# Patient Record
Sex: Female | Born: 1945
Health system: Southern US, Community
[De-identification: ages and names within clinical notes are randomized; demographics above are authoritative.]

## PROBLEM LIST (undated history)

## (undated) DIAGNOSIS — I10 Essential (primary) hypertension: Secondary | ICD-10-CM

## (undated) DIAGNOSIS — H269 Unspecified cataract: Secondary | ICD-10-CM

## (undated) DIAGNOSIS — M1990 Unspecified osteoarthritis, unspecified site: Secondary | ICD-10-CM

## (undated) HISTORY — PX: EYE SURGERY: SHX253

## (undated) HISTORY — PX: ABDOMINAL HYSTERECTOMY: SHX81

---

## 1998-07-15 ENCOUNTER — Other Ambulatory Visit: Admission: RE | Admit: 1998-07-15 | Discharge: 1998-07-15 | Payer: Self-pay | Admitting: Family Medicine

## 1999-11-15 ENCOUNTER — Other Ambulatory Visit: Admission: RE | Admit: 1999-11-15 | Discharge: 1999-11-15 | Payer: Self-pay | Admitting: Family Medicine

## 1999-11-21 ENCOUNTER — Encounter: Payer: Self-pay | Admitting: Family Medicine

## 1999-11-21 ENCOUNTER — Ambulatory Visit (HOSPITAL_COMMUNITY): Admission: RE | Admit: 1999-11-21 | Discharge: 1999-11-21 | Payer: Self-pay | Admitting: Family Medicine

## 2000-09-06 ENCOUNTER — Encounter: Payer: Self-pay | Admitting: Family Medicine

## 2000-09-06 ENCOUNTER — Encounter: Admission: RE | Admit: 2000-09-06 | Discharge: 2000-09-06 | Payer: Self-pay | Admitting: Family Medicine

## 2001-01-16 ENCOUNTER — Other Ambulatory Visit: Admission: RE | Admit: 2001-01-16 | Discharge: 2001-01-16 | Payer: Self-pay | Admitting: Family Medicine

## 2001-02-02 ENCOUNTER — Emergency Department (HOSPITAL_COMMUNITY): Admission: EM | Admit: 2001-02-02 | Discharge: 2001-02-02 | Payer: Self-pay | Admitting: Emergency Medicine

## 2002-03-11 ENCOUNTER — Other Ambulatory Visit: Admission: RE | Admit: 2002-03-11 | Discharge: 2002-03-11 | Payer: Self-pay | Admitting: Family Medicine

## 2003-07-07 ENCOUNTER — Other Ambulatory Visit: Admission: RE | Admit: 2003-07-07 | Discharge: 2003-07-07 | Payer: Self-pay | Admitting: Family Medicine

## 2003-10-20 ENCOUNTER — Encounter: Admission: RE | Admit: 2003-10-20 | Discharge: 2003-10-20 | Payer: Self-pay | Admitting: Family Medicine

## 2004-01-22 ENCOUNTER — Ambulatory Visit (HOSPITAL_COMMUNITY): Admission: RE | Admit: 2004-01-22 | Discharge: 2004-01-22 | Payer: Self-pay | Admitting: Gastroenterology

## 2005-08-07 ENCOUNTER — Encounter: Admission: RE | Admit: 2005-08-07 | Discharge: 2005-08-07 | Payer: Self-pay | Admitting: Family Medicine

## 2005-09-04 ENCOUNTER — Ambulatory Visit (HOSPITAL_COMMUNITY): Admission: RE | Admit: 2005-09-04 | Discharge: 2005-09-04 | Payer: Self-pay | Admitting: Family Medicine

## 2006-07-25 ENCOUNTER — Other Ambulatory Visit: Admission: RE | Admit: 2006-07-25 | Discharge: 2006-07-25 | Payer: Self-pay | Admitting: Family Medicine

## 2007-08-30 ENCOUNTER — Other Ambulatory Visit: Admission: RE | Admit: 2007-08-30 | Discharge: 2007-08-30 | Payer: Self-pay | Admitting: Family Medicine

## 2007-09-22 ENCOUNTER — Emergency Department (HOSPITAL_COMMUNITY): Admission: EM | Admit: 2007-09-22 | Discharge: 2007-09-22 | Payer: Self-pay | Admitting: Emergency Medicine

## 2008-02-03 ENCOUNTER — Ambulatory Visit (HOSPITAL_COMMUNITY): Admission: RE | Admit: 2008-02-03 | Discharge: 2008-02-03 | Payer: Self-pay | Admitting: Family Medicine

## 2009-03-13 ENCOUNTER — Emergency Department (HOSPITAL_COMMUNITY): Admission: EM | Admit: 2009-03-13 | Discharge: 2009-03-13 | Payer: Self-pay | Admitting: Emergency Medicine

## 2010-01-11 ENCOUNTER — Other Ambulatory Visit: Admission: RE | Admit: 2010-01-11 | Discharge: 2010-01-11 | Payer: Self-pay | Admitting: Family Medicine

## 2010-01-19 ENCOUNTER — Encounter: Admission: RE | Admit: 2010-01-19 | Discharge: 2010-01-19 | Payer: Self-pay | Admitting: Family Medicine

## 2010-10-15 ENCOUNTER — Encounter: Payer: Self-pay | Admitting: Family Medicine

## 2011-01-02 LAB — POCT I-STAT, CHEM 8
BUN: 11 mg/dL (ref 6–23)
Calcium, Ion: 1.27 mmol/L (ref 1.12–1.32)
Creatinine, Ser: 0.8 mg/dL (ref 0.4–1.2)
HCT: 46 % (ref 36.0–46.0)
Hemoglobin: 15.6 g/dL — ABNORMAL HIGH (ref 12.0–15.0)
Potassium: 2.9 mEq/L — ABNORMAL LOW (ref 3.5–5.1)
TCO2: 37 mmol/L (ref 0–100)

## 2011-01-02 LAB — URINALYSIS, ROUTINE W REFLEX MICROSCOPIC
Protein, ur: NEGATIVE mg/dL
Urobilinogen, UA: 0.2 mg/dL (ref 0.0–1.0)

## 2011-02-10 NOTE — Op Note (Signed)
NAME:  Theresa Casey, Theresa Casey NO.:  [id number]   MEDICAL RECORD NO.:  [id number]                   PATIENT TYPE:  AMB   LOCATION:  ENDO                                 FACILITY:  [facility]   PHYSICIAN:  Graylin Shiver, M.D.                DATE OF BIRTH:  1946/02/14   DATE OF PROCEDURE:  01/22/2004  DATE OF DISCHARGE:                                 OPERATIVE REPORT   PROCEDURE:  Flexible sigmoidoscopy.   INDICATIONS FOR PROCEDURE:  Screening.   Informed consent was obtained after explanation of the risks of bleeding,  infection and perforation.   PREMEDICATION:  Fentanyl 55 mcg IV, Versed 5 mg IV.   DESCRIPTION OF PROCEDURE:  A full colonoscopy was intended for today. A  rectal exam was performed, no masses were felt.  The Olympus pediatric  adjustable colonoscope was inserted into the rectum and advanced into the  sigmoid colon. The sigmoid was fixed and tortous and cold not pass the scope  beyond about 20 cm.  I then removed the pediatric colonoscope and attempted  with a gastroscope.  I was able to get the gastroscope a little further into  the sigmoid but the sigmoid was fixed and tortuous and the scope would not  pass despite applying pressure to the abdomen and changing position of the  patient.  No specific abnormalities were seen. She tolerated the procedure  well without complications.   IMPRESSION:  Fixed sigmoid.   PLAN:  Barium enema.                                               Graylin Shiver, M.D.    Germain Osgood  D:  01/22/2004  T:  01/22/2004  Job:  [id number]   cc:   Renaye Rakers, M.D.  [phone] [address], [address]  [place]  [place] [address]  Fax: [phone]

## 2012-06-19 ENCOUNTER — Other Ambulatory Visit (HOSPITAL_COMMUNITY)
Admission: RE | Admit: 2012-06-19 | Discharge: 2012-06-19 | Disposition: A | Discharge status: Home / Self Care | Payer: Medicare Other | Source: Ambulatory Visit | Attending: Family Medicine | Admitting: Family Medicine

## 2012-06-19 DIAGNOSIS — Z124 Encounter for screening for malignant neoplasm of cervix: Secondary | ICD-10-CM | POA: Insufficient documentation

## 2012-06-19 DIAGNOSIS — Z1151 Encounter for screening for human papillomavirus (HPV): Secondary | ICD-10-CM | POA: Insufficient documentation

## 2015-08-06 ENCOUNTER — Other Ambulatory Visit: Payer: Self-pay | Admitting: Family Medicine

## 2015-08-06 ENCOUNTER — Ambulatory Visit
Admission: RE | Admit: 2015-08-06 | Discharge: 2015-08-06 | Disposition: A | Discharge status: Home / Self Care | Payer: Medicare HMO | Source: Ambulatory Visit | Attending: Family Medicine | Admitting: Family Medicine

## 2015-08-06 DIAGNOSIS — M25562 Pain in left knee: Secondary | ICD-10-CM

## 2015-08-06 DIAGNOSIS — G8929 Other chronic pain: Secondary | ICD-10-CM

## 2015-08-06 DIAGNOSIS — M25561 Pain in right knee: Secondary | ICD-10-CM

## 2015-08-06 DIAGNOSIS — M25552 Pain in left hip: Secondary | ICD-10-CM

## 2015-08-06 DIAGNOSIS — M545 Low back pain: Secondary | ICD-10-CM

## 2015-08-06 DIAGNOSIS — M533 Sacrococcygeal disorders, not elsewhere classified: Secondary | ICD-10-CM

## 2015-08-06 DIAGNOSIS — M1250 Traumatic arthropathy, unspecified site: Secondary | ICD-10-CM

## 2015-08-06 DIAGNOSIS — M25551 Pain in right hip: Secondary | ICD-10-CM

## 2015-08-09 ENCOUNTER — Ambulatory Visit
Admission: RE | Admit: 2015-08-09 | Discharge: 2015-08-09 | Disposition: A | Discharge status: Home / Self Care | Payer: Medicare HMO | Source: Ambulatory Visit | Attending: Family Medicine | Admitting: Family Medicine

## 2015-08-09 ENCOUNTER — Other Ambulatory Visit: Payer: Self-pay | Admitting: Family Medicine

## 2015-08-09 DIAGNOSIS — M1250 Traumatic arthropathy, unspecified site: Secondary | ICD-10-CM

## 2015-08-11 ENCOUNTER — Ambulatory Visit
Admission: RE | Admit: 2015-08-11 | Discharge: 2015-08-11 | Disposition: A | Discharge status: Home / Self Care | Payer: Medicare HMO | Source: Ambulatory Visit | Attending: Family Medicine | Admitting: Family Medicine

## 2015-08-11 DIAGNOSIS — M1250 Traumatic arthropathy, unspecified site: Secondary | ICD-10-CM

## 2015-10-14 DIAGNOSIS — E876 Hypokalemia: Secondary | ICD-10-CM | POA: Diagnosis not present

## 2015-10-14 DIAGNOSIS — I10 Essential (primary) hypertension: Secondary | ICD-10-CM | POA: Diagnosis not present

## 2015-10-14 DIAGNOSIS — M1250 Traumatic arthropathy, unspecified site: Secondary | ICD-10-CM | POA: Diagnosis not present

## 2015-11-06 DIAGNOSIS — L853 Xerosis cutis: Secondary | ICD-10-CM | POA: Diagnosis not present

## 2016-01-19 DIAGNOSIS — H2513 Age-related nuclear cataract, bilateral: Secondary | ICD-10-CM | POA: Diagnosis not present

## 2016-02-22 DIAGNOSIS — H18413 Arcus senilis, bilateral: Secondary | ICD-10-CM | POA: Diagnosis not present

## 2016-03-30 DIAGNOSIS — H04563 Stenosis of bilateral lacrimal punctum: Secondary | ICD-10-CM | POA: Diagnosis not present

## 2016-04-29 DIAGNOSIS — M1280 Other specific arthropathies, not elsewhere classified, unspecified site: Secondary | ICD-10-CM | POA: Diagnosis not present

## 2016-04-29 DIAGNOSIS — I10 Essential (primary) hypertension: Secondary | ICD-10-CM | POA: Diagnosis not present

## 2016-09-15 DIAGNOSIS — R7301 Impaired fasting glucose: Secondary | ICD-10-CM | POA: Diagnosis not present

## 2016-09-15 DIAGNOSIS — H6123 Impacted cerumen, bilateral: Secondary | ICD-10-CM | POA: Diagnosis not present

## 2016-09-15 DIAGNOSIS — I10 Essential (primary) hypertension: Secondary | ICD-10-CM | POA: Diagnosis not present

## 2016-09-15 DIAGNOSIS — E876 Hypokalemia: Secondary | ICD-10-CM | POA: Diagnosis not present

## 2016-09-16 DIAGNOSIS — H6123 Impacted cerumen, bilateral: Secondary | ICD-10-CM | POA: Diagnosis not present

## 2016-09-19 DIAGNOSIS — H6123 Impacted cerumen, bilateral: Secondary | ICD-10-CM | POA: Diagnosis not present

## 2017-02-14 LAB — GLUCOSE, POCT (MANUAL RESULT ENTRY): POC Glucose: 90 mg/dl (ref 70–99)

## 2017-04-06 ENCOUNTER — Encounter (HOSPITAL_COMMUNITY): Payer: Self-pay | Admitting: Emergency Medicine

## 2017-04-06 ENCOUNTER — Emergency Department (HOSPITAL_COMMUNITY)
Admission: EM | Admit: 2017-04-06 | Discharge: 2017-04-07 | Disposition: A | Discharge status: Home / Self Care | Payer: Medicare PPO | Attending: Emergency Medicine | Admitting: Emergency Medicine

## 2017-04-06 DIAGNOSIS — Z7982 Long term (current) use of aspirin: Secondary | ICD-10-CM | POA: Diagnosis not present

## 2017-04-06 DIAGNOSIS — Z79899 Other long term (current) drug therapy: Secondary | ICD-10-CM | POA: Diagnosis not present

## 2017-04-06 DIAGNOSIS — F17200 Nicotine dependence, unspecified, uncomplicated: Secondary | ICD-10-CM | POA: Insufficient documentation

## 2017-04-06 DIAGNOSIS — I10 Essential (primary) hypertension: Secondary | ICD-10-CM | POA: Insufficient documentation

## 2017-04-06 DIAGNOSIS — R197 Diarrhea, unspecified: Secondary | ICD-10-CM

## 2017-04-06 DIAGNOSIS — R112 Nausea with vomiting, unspecified: Secondary | ICD-10-CM | POA: Diagnosis present

## 2017-04-06 DIAGNOSIS — K529 Noninfective gastroenteritis and colitis, unspecified: Secondary | ICD-10-CM | POA: Diagnosis not present

## 2017-04-06 DIAGNOSIS — R1084 Generalized abdominal pain: Secondary | ICD-10-CM

## 2017-04-06 HISTORY — DX: Unspecified osteoarthritis, unspecified site: M19.90

## 2017-04-06 HISTORY — DX: Essential (primary) hypertension: I10

## 2017-04-06 LAB — COMPREHENSIVE METABOLIC PANEL
ALBUMIN: 3.4 g/dL — AB (ref 3.5–5.0)
ALT: 13 U/L — ABNORMAL LOW (ref 14–54)
ANION GAP: 7 (ref 5–15)
AST: 16 U/L (ref 15–41)
Alkaline Phosphatase: 70 U/L (ref 38–126)
BILIRUBIN TOTAL: 0.8 mg/dL (ref 0.3–1.2)
BUN: 15 mg/dL (ref 6–20)
CO2: 29 mmol/L (ref 22–32)
Calcium: 8.9 mg/dL (ref 8.9–10.3)
Chloride: 105 mmol/L (ref 101–111)
Creatinine, Ser: 0.8 mg/dL (ref 0.44–1.00)
GFR calc Af Amer: >60 mL/min (ref >60)
GLUCOSE: 106 mg/dL — AB (ref 65–99)
POTASSIUM: 3.1 mmol/L — AB (ref 3.5–5.1)
Sodium: 141 mmol/L (ref 135–145)
TOTAL PROTEIN: 6.2 g/dL — AB (ref 6.5–8.1)

## 2017-04-06 LAB — CBC
HEMATOCRIT: 39.4 % (ref 36.0–46.0)
Hemoglobin: 12.7 g/dL (ref 12.0–15.0)
MCH: 29.8 pg (ref 26.0–34.0)
MCHC: 32.2 g/dL (ref 30.0–36.0)
MCV: 92.5 fL (ref 78.0–100.0)
Platelets: 167 10*3/uL (ref 150–400)
RBC: 4.26 MIL/uL (ref 3.87–5.11)
RDW: 13.9 % (ref 11.5–15.5)
WBC: 13.2 10*3/uL — AB (ref 4.0–10.5)

## 2017-04-06 LAB — URINALYSIS, ROUTINE W REFLEX MICROSCOPIC
BILIRUBIN URINE: NEGATIVE
GLUCOSE, UA: NEGATIVE mg/dL
Ketones, ur: NEGATIVE mg/dL
LEUKOCYTES UA: NEGATIVE
NITRITE: NEGATIVE
Protein, ur: NEGATIVE mg/dL
Specific Gravity, Urine: 1.021 (ref 1.005–1.030)
pH: 5 (ref 5.0–8.0)

## 2017-04-06 LAB — LIPASE, BLOOD: LIPASE: 34 U/L (ref 11–51)

## 2017-04-06 NOTE — ED Triage Notes (Signed)
Pt presents with diarrhea, chills, some abd pain since Wednesday when she ate some gumbo; denies fever; pt reports not eating much in the last 24 hrs

## 2017-04-06 NOTE — ED Notes (Signed)
Pt updated on wait for treatment room.  States she is going to step outside for a little bit.

## 2017-04-06 NOTE — ED Notes (Signed)
Pt updated that she is next to go to treatment room.

## 2017-04-07 ENCOUNTER — Emergency Department (HOSPITAL_COMMUNITY): Payer: Medicare PPO

## 2017-04-07 ENCOUNTER — Encounter (HOSPITAL_COMMUNITY): Payer: Self-pay

## 2017-04-07 LAB — I-STAT CG4 LACTIC ACID, ED
LACTIC ACID, VENOUS: 1.6 mmol/L (ref 0.5–1.9)
Lactic Acid, Venous: 1.42 mmol/L (ref 0.5–1.9)

## 2017-04-07 MED ORDER — CIPROFLOXACIN HCL 500 MG PO TABS: 500.0000 mg | ORAL_TABLET | Freq: Two times a day (BID) | ORAL | 0 refills | Status: DC

## 2017-04-07 MED ORDER — CIPROFLOXACIN IN D5W 400 MG/200ML IV SOLN
400.0000 mg | Freq: Once | INTRAVENOUS | Status: AC
  Administered 2017-04-07: 400 mg via INTRAVENOUS
  Filled 2017-04-07: qty 200

## 2017-04-07 MED ORDER — METRONIDAZOLE 500 MG PO TABS: 500.0000 mg | ORAL_TABLET | Freq: Three times a day (TID) | ORAL | 0 refills | Status: DC

## 2017-04-07 MED ORDER — SODIUM CHLORIDE 0.9 % IV BOLUS (SEPSIS)
1000.0000 mL | Freq: Once | INTRAVENOUS | Status: AC
  Administered 2017-04-07: 1000 mL via INTRAVENOUS

## 2017-04-07 MED ORDER — IOPAMIDOL (ISOVUE-300) INJECTION 61%
INTRAVENOUS | Status: AC
  Administered 2017-04-07: 80 mL
  Filled 2017-04-07: qty 100

## 2017-04-07 MED ORDER — POTASSIUM CHLORIDE 10 MEQ/100ML IV SOLN
10.0000 meq | INTRAVENOUS | Status: AC
  Administered 2017-04-07: 10 meq via INTRAVENOUS
  Filled 2017-04-07 (×2): qty 100

## 2017-04-07 MED ORDER — METRONIDAZOLE IN NACL 5-0.79 MG/ML-% IV SOLN
500.0000 mg | Freq: Once | INTRAVENOUS | Status: AC
  Administered 2017-04-07: 500 mg via INTRAVENOUS
  Filled 2017-04-07: qty 100

## 2017-04-07 NOTE — ED Provider Notes (Signed)
[facility]-EMERGENCY DEPT Provider Note   CSN: [id number] Arrival date & time: 04/06/17  1726     History   Chief Complaint Chief Complaint  Patient presents with  . Abdominal Pain  . Diarrhea  . Chills    HPI Theresa Casey is a 71 y.o. female with a hx of HTN, arthritis, abd hysterectomy presents to the Emergency Department complaining of gradual, persistent, progressively worsening N/V/D onset Wednesday night after eating. Patient reports generalized abdominal soreness but no focal abdominal pain. Symptoms improved slightly on Thursday but after eating Hibachi steak her loose stools returned.  No nausea or vomiting today.  Pt denies fever, chest pain, SOB, myalgias.  Associated symptoms include dark urine in the last 48 hrs.  Pt is a daily smoker.  No known aggravating or alleviating factors. Pt denies Fever, headache, neck pain, chest pain, shortness of breath, hematemesis, melena, hematochezia, dysuria, hematuria, weakness, dizziness, syncope, near syncope.      The history is provided by the patient and medical records. No language interpreter was used.    Past Medical History:  Diagnosis Date  . Arthritis   . Hypertension     There are no active problems to display for this patient.   Past Surgical History:  Procedure Laterality Date  . ABDOMINAL HYSTERECTOMY      OB History    No data available       Home Medications    Prior to Admission medications   Medication Sig Start Date End Date Taking? Authorizing Provider  aspirin EC 81 MG tablet Take 81 mg by mouth at bedtime.   Yes [provider]  atenolol-chlorthalidone (TENORETIC) 50-25 MG tablet Take 0.5 tablets by mouth daily. 01/23/17  Yes [provider]  COD LIVER OIL PO Take 2 tablets by mouth daily.   Yes [provider]  naproxen (NAPROSYN) 500 MG tablet Take 1,000 mg by mouth at bedtime. 02/02/17  Yes [provider]  Potassium Chloride ER 20 MEQ TBCR Take 20 mEq by  mouth daily. 01/23/17  Yes [provider]  ciprofloxacin (CIPRO) 500 MG tablet Take 1 tablet (500 mg total) by mouth every 12 (twelve) hours. 04/07/17   Frutoso Dimare, Dahlia Client, PA-C  metroNIDAZOLE (FLAGYL) 500 MG tablet Take 1 tablet (500 mg total) by mouth 3 (three) times daily. 04/07/17   Nassir Neidert, Boyd Kerbs    Family History History reviewed. No pertinent family history.  Social History Social History  Substance Use Topics  . Smoking status: Current Some Day Smoker  . Smokeless tobacco: Never Used  . Alcohol use Yes     Comment: rare     Allergies   Sulfa antibiotics   Review of Systems Review of Systems  Constitutional: Positive for chills. Negative for appetite change, diaphoresis, fatigue, fever and unexpected weight change.  HENT: Negative for mouth sores.   Eyes: Negative for visual disturbance.  Respiratory: Negative for cough, chest tightness, shortness of breath and wheezing.   Cardiovascular: Negative for chest pain.  Gastrointestinal: Positive for abdominal pain ( Soreness), diarrhea, nausea and vomiting. Negative for constipation.  Endocrine: Negative for polydipsia, polyphagia and polyuria.  Genitourinary: Negative for dysuria, frequency, hematuria and urgency.  Musculoskeletal: Negative for back pain and neck stiffness.  Skin: Negative for rash.  Allergic/Immunologic: Negative for immunocompromised state.  Neurological: Negative for syncope, light-headedness and headaches.  Hematological: Does not bruise/bleed easily.  Psychiatric/Behavioral: Negative for sleep disturbance. The patient is not nervous/anxious.   All other systems reviewed and are negative.  Physical Exam Updated Vital Signs BP 100/63 (BP Location: Left Arm)   Pulse (!) 59   Temp 99.1 F (37.3 C) (Oral)   Resp 17   SpO2 97%   Physical Exam  Constitutional: She appears well-developed and well-nourished. No distress.  Awake, alert, nontoxic appearance  HENT:  Head:  Normocephalic and atraumatic.  Mouth/Throat: Oropharynx is clear and moist. No oropharyngeal exudate.  Moist mucous membranes  Eyes: Conjunctivae are normal. No scleral icterus.  Neck: Normal range of motion. Neck supple.  Cardiovascular: Normal rate, regular rhythm and intact distal pulses.   Pulmonary/Chest: Effort normal and breath sounds normal. No respiratory distress. She has no wheezes.  Equal chest expansion  Abdominal: Soft. Bowel sounds are normal. She exhibits no mass. There is no hepatosplenomegaly. There is generalized tenderness ( Very mild). There is no rigidity, no rebound, no guarding and no CVA tenderness.  Musculoskeletal: Normal range of motion. She exhibits no edema.  Neurological: She is alert.  Speech is clear and goal oriented Moves extremities without ataxia  Skin: Skin is warm and dry. She is not diaphoretic.  Psychiatric: She has a normal mood and affect.  Nursing note and vitals reviewed.    ED Treatments / Results  Labs (all labs ordered are listed, but only abnormal results are displayed) Labs Reviewed  COMPREHENSIVE METABOLIC PANEL - Abnormal; Notable for the following:       Result Value   Potassium 3.1 (*)    Glucose, Bld 106 (*)    Total Protein 6.2 (*)    Albumin 3.4 (*)    ALT 13 (*)    All other components within normal limits  CBC - Abnormal; Notable for the following:    WBC 13.2 (*)    All other components within normal limits  URINALYSIS, ROUTINE W REFLEX MICROSCOPIC - Abnormal; Notable for the following:    APPearance HAZY (*)    Hgb urine dipstick SMALL (*)    Bacteria, UA FEW (*)    Squamous Epithelial / LPF 0-5 (*)    All other components within normal limits  GASTROINTESTINAL PANEL BY PCR, STOOL (REPLACES STOOL CULTURE)  LIPASE, BLOOD  I-STAT CG4 LACTIC ACID, ED  I-STAT CG4 LACTIC ACID, ED    Radiology Ct Abdomen Pelvis W Contrast  Result Date: 04/07/2017 CLINICAL DATA:  71 year old female with abdominal pain, nausea  vomiting and diarrhea. EXAM: CT ABDOMEN AND PELVIS WITH CONTRAST TECHNIQUE: Multidetector CT imaging of the abdomen and pelvis was performed using the standard protocol following bolus administration of intravenous contrast. CONTRAST:  ISOVUE-300 IOPAMIDOL (ISOVUE-300) INJECTION 61% COMPARISON:  None. FINDINGS: Lower chest: The visualized lung bases are clear. No intra-abdominal free air or free fluid. Hepatobiliary: A 12 mm low attenuating lesion in the right lobe of the liver is not well characterized most likely represent a cyst or hemangioma. MRI may provide better characterisation. There is no intrahepatic biliary ductal dilatation. The gallbladder is unremarkable. Pancreas: Unremarkable. No pancreatic ductal dilatation or surrounding inflammatory changes. Spleen: Normal in size without focal abnormality. Adrenals/Urinary Tract: The adrenal glands are unremarkable. There is a 1 cm left renal cyst. Probable 3 mm nonobstructing left renal interpolar stone. There is no hydronephrosis on either side. There is symmetric enhancement and excretion of contrast by both kidneys. The urinary bladder is only partially distended and appears grossly unremarkable. Stomach/Bowel: There scattered distal descending colon/ sigmoid diverticula without active inflammatory changes. There is diffuse thickening and inflammatory changes of the colon consistent with colitis. Correlation with clinical  exam and stool cultures recommended. There is mild thickened appearance of the stomach which may be related to underdistention or reactive changes of colitis. There is no evidence of bowel obstruction. The appendix is normal. Vascular/Lymphatic: The abdominal aorta and IVC appear unremarkable. The origins of the celiac axis, SMA, IMA and the origins of the renal arteries are patent. The SMV, splenic vein, and main portal vein are patent. No portal venous gas identified. There is no adenopathy. Reproductive: Hysterectomy.  No pelvic masses.  Other: Midline vertical anterior abdominal wall incisional scar. Musculoskeletal: Degenerative changes of the spine. No acute osseous pathology. IMPRESSION: 1. Colitis, likely infectious in etiology. Correlation with clinical exam and stool cultures recommended. No bowel obstruction. Normal appendix. 2. Sigmoid diverticulosis. Electronically Signed   By: Elgie Collard M.D.   On: 04/07/2017 03:51    Procedures Procedures (including critical care time)  Medications Ordered in ED Medications  potassium chloride 10 mEq in 100 mL IVPB (0 mEq Intravenous Stopped 04/07/17 0604)  sodium chloride 0.9 % bolus 1,000 mL (0 mLs Intravenous Stopped 04/07/17 0605)  iopamidol (ISOVUE-300) 61 % injection (80 mLs  Contrast Given 04/07/17 0315)  ciprofloxacin (CIPRO) IVPB 400 mg (0 mg Intravenous Stopped 04/07/17 0604)  metroNIDAZOLE (FLAGYL) IVPB 500 mg (0 mg Intravenous Stopped 04/07/17 0604)     Initial Impression / Assessment and Plan / ED Course  I have reviewed the triage vital signs and the nursing notes.  Pertinent labs & imaging results that were available during my care of the patient were reviewed by me and considered in my medical decision making (see chart for details).     Patient presents with nausea, vomiting, diarrhea and generalized abdominal soreness. Symptoms have improved over the last several days. Suspect likely viral gastroenteritis however patient with leukocytosis and complaints of chills. Will obtain CT abdomen pelvis and give fluid bolus. Hypokalemia noted with repletion begun in the department.  CT with evidence of colitis. Patient is well-appearing and has tolerated by mouth. Abdomen remains soft. Patient given fluids, potassium and an orthotics. Will be discharged home with Cipro and Flagyl.  The patient was discussed with and seen by Dr. Patria Mane who agrees with the treatment plan.   Final Clinical Impressions(s) / ED Diagnoses   Final diagnoses:  Colitis  Generalized  abdominal pain  Diarrhea of presumed infectious origin    New Prescriptions Discharge Medication List as of 04/07/2017  5:37 AM    START taking these medications   Details  ciprofloxacin (CIPRO) 500 MG tablet Take 1 tablet (500 mg total) by mouth every 12 (twelve) hours., Starting Sat 04/07/2017, Print    metroNIDAZOLE (FLAGYL) 500 MG tablet Take 1 tablet (500 mg total) by mouth 3 (three) times daily., Starting Sat 04/07/2017, Print         Eylin Pontarelli, Boyd Kerbs 04/07/17 1610    Azalia Bilis, MD 04/07/17 [phone]

## 2017-04-07 NOTE — Discharge Instructions (Signed)
1. Medications: cipro, flagyl, usual home medications °2. Treatment: rest, drink plenty of fluids, advance diet slowly °3. Follow Up: Please followup with your primary doctor in 2 days for discussion of your diagnoses and further evaluation after today's visit; if you do not have a primary care doctor use the resource guide provided to find one; Please return to the ER for persistent vomiting, high fevers or worsening symptoms ° °

## 2017-04-17 ENCOUNTER — Other Ambulatory Visit: Payer: Self-pay | Admitting: Family Medicine

## 2017-04-17 ENCOUNTER — Other Ambulatory Visit (HOSPITAL_COMMUNITY)
Admission: RE | Admit: 2017-04-17 | Discharge: 2017-04-17 | Disposition: A | Discharge status: Home / Self Care | Payer: Medicare PPO | Source: Ambulatory Visit | Attending: Family Medicine | Admitting: Family Medicine

## 2017-04-17 DIAGNOSIS — Z113 Encounter for screening for infections with a predominantly sexual mode of transmission: Secondary | ICD-10-CM | POA: Diagnosis present

## 2017-04-20 LAB — URINE CYTOLOGY ANCILLARY ONLY
Bacterial vaginitis: NEGATIVE
CANDIDA VAGINITIS: NEGATIVE
CHLAMYDIA, DNA PROBE: NEGATIVE
NEISSERIA GONORRHEA: NEGATIVE
Trichomonas: NEGATIVE

## 2017-05-17 DIAGNOSIS — I10 Essential (primary) hypertension: Secondary | ICD-10-CM | POA: Diagnosis not present

## 2017-05-17 DIAGNOSIS — Z0000 Encounter for general adult medical examination without abnormal findings: Secondary | ICD-10-CM | POA: Diagnosis not present

## 2017-05-17 DIAGNOSIS — E876 Hypokalemia: Secondary | ICD-10-CM | POA: Diagnosis not present

## 2017-08-07 DIAGNOSIS — M62838 Other muscle spasm: Secondary | ICD-10-CM | POA: Diagnosis not present

## 2017-08-23 DIAGNOSIS — M25571 Pain in right ankle and joints of right foot: Secondary | ICD-10-CM | POA: Diagnosis not present

## 2017-08-23 DIAGNOSIS — M545 Low back pain: Secondary | ICD-10-CM | POA: Diagnosis not present

## 2017-08-23 DIAGNOSIS — M25572 Pain in left ankle and joints of left foot: Secondary | ICD-10-CM | POA: Diagnosis not present

## 2017-08-29 DIAGNOSIS — M1280 Other specific arthropathies, not elsewhere classified, unspecified site: Secondary | ICD-10-CM | POA: Diagnosis not present

## 2017-08-29 DIAGNOSIS — I10 Essential (primary) hypertension: Secondary | ICD-10-CM | POA: Diagnosis not present

## 2017-08-29 DIAGNOSIS — M549 Dorsalgia, unspecified: Secondary | ICD-10-CM | POA: Diagnosis not present

## 2017-08-31 DIAGNOSIS — M25551 Pain in right hip: Secondary | ICD-10-CM | POA: Diagnosis not present

## 2017-08-31 DIAGNOSIS — M545 Low back pain: Secondary | ICD-10-CM | POA: Diagnosis not present

## 2017-08-31 DIAGNOSIS — M25562 Pain in left knee: Secondary | ICD-10-CM | POA: Diagnosis not present

## 2017-12-25 DIAGNOSIS — Z0000 Encounter for general adult medical examination without abnormal findings: Secondary | ICD-10-CM | POA: Diagnosis not present

## 2017-12-25 DIAGNOSIS — I10 Essential (primary) hypertension: Secondary | ICD-10-CM | POA: Diagnosis not present

## 2017-12-25 DIAGNOSIS — M1280 Other specific arthropathies, not elsewhere classified, unspecified site: Secondary | ICD-10-CM | POA: Diagnosis not present

## 2017-12-25 DIAGNOSIS — M549 Dorsalgia, unspecified: Secondary | ICD-10-CM | POA: Diagnosis not present

## 2017-12-27 ENCOUNTER — Other Ambulatory Visit: Payer: Self-pay | Admitting: Family Medicine

## 2017-12-27 DIAGNOSIS — F17200 Nicotine dependence, unspecified, uncomplicated: Secondary | ICD-10-CM

## 2018-01-02 DIAGNOSIS — Z1231 Encounter for screening mammogram for malignant neoplasm of breast: Secondary | ICD-10-CM | POA: Diagnosis not present

## 2018-01-02 DIAGNOSIS — M810 Age-related osteoporosis without current pathological fracture: Secondary | ICD-10-CM | POA: Diagnosis not present

## 2018-01-02 DIAGNOSIS — M8589 Other specified disorders of bone density and structure, multiple sites: Secondary | ICD-10-CM | POA: Diagnosis not present

## 2018-01-07 ENCOUNTER — Ambulatory Visit
Admission: RE | Admit: 2018-01-07 | Discharge: 2018-01-07 | Disposition: A | Discharge status: Home / Self Care | Payer: Medicare HMO | Source: Ambulatory Visit | Attending: Family Medicine | Admitting: Family Medicine

## 2018-01-07 DIAGNOSIS — F17200 Nicotine dependence, unspecified, uncomplicated: Secondary | ICD-10-CM

## 2018-02-15 DIAGNOSIS — H5210 Myopia, unspecified eye: Secondary | ICD-10-CM | POA: Diagnosis not present

## 2018-02-28 DIAGNOSIS — H2511 Age-related nuclear cataract, right eye: Secondary | ICD-10-CM | POA: Diagnosis not present

## 2018-02-28 DIAGNOSIS — H2512 Age-related nuclear cataract, left eye: Secondary | ICD-10-CM | POA: Diagnosis not present

## 2018-03-14 DIAGNOSIS — H2511 Age-related nuclear cataract, right eye: Secondary | ICD-10-CM | POA: Diagnosis not present

## 2018-03-14 DIAGNOSIS — H25811 Combined forms of age-related cataract, right eye: Secondary | ICD-10-CM | POA: Diagnosis not present

## 2018-03-26 DIAGNOSIS — L601 Onycholysis: Secondary | ICD-10-CM | POA: Diagnosis not present

## 2018-03-26 DIAGNOSIS — I10 Essential (primary) hypertension: Secondary | ICD-10-CM | POA: Diagnosis not present

## 2018-04-05 DIAGNOSIS — H02052 Trichiasis without entropian right lower eyelid: Secondary | ICD-10-CM | POA: Diagnosis not present

## 2018-05-14 DIAGNOSIS — B0052 Herpesviral keratitis: Secondary | ICD-10-CM | POA: Diagnosis not present

## 2018-05-20 DIAGNOSIS — H18213 Corneal edema secondary to contact lens, bilateral: Secondary | ICD-10-CM | POA: Diagnosis not present

## 2018-05-29 DIAGNOSIS — B0052 Herpesviral keratitis: Secondary | ICD-10-CM | POA: Diagnosis not present

## 2018-06-13 DIAGNOSIS — H25812 Combined forms of age-related cataract, left eye: Secondary | ICD-10-CM | POA: Diagnosis not present

## 2018-06-13 DIAGNOSIS — H2512 Age-related nuclear cataract, left eye: Secondary | ICD-10-CM | POA: Diagnosis not present

## 2018-06-13 DIAGNOSIS — Z01818 Encounter for other preprocedural examination: Secondary | ICD-10-CM | POA: Diagnosis not present

## 2018-06-24 DIAGNOSIS — R634 Abnormal weight loss: Secondary | ICD-10-CM | POA: Diagnosis not present

## 2018-06-24 DIAGNOSIS — I10 Essential (primary) hypertension: Secondary | ICD-10-CM | POA: Diagnosis not present

## 2018-07-15 DIAGNOSIS — I10 Essential (primary) hypertension: Secondary | ICD-10-CM | POA: Diagnosis not present

## 2018-07-15 DIAGNOSIS — Z681 Body mass index (BMI) 19 or less, adult: Secondary | ICD-10-CM | POA: Diagnosis not present

## 2018-08-09 DIAGNOSIS — H18213 Corneal edema secondary to contact lens, bilateral: Secondary | ICD-10-CM | POA: Diagnosis not present

## 2018-10-16 DIAGNOSIS — M654 Radial styloid tenosynovitis [de Quervain]: Secondary | ICD-10-CM | POA: Diagnosis not present

## 2019-04-02 DIAGNOSIS — R7301 Impaired fasting glucose: Secondary | ICD-10-CM | POA: Diagnosis not present

## 2019-04-02 DIAGNOSIS — I10 Essential (primary) hypertension: Secondary | ICD-10-CM | POA: Diagnosis not present

## 2019-04-02 DIAGNOSIS — R55 Syncope and collapse: Secondary | ICD-10-CM | POA: Diagnosis not present

## 2019-04-16 DIAGNOSIS — R5383 Other fatigue: Secondary | ICD-10-CM | POA: Diagnosis not present

## 2019-04-16 DIAGNOSIS — M1250 Traumatic arthropathy, unspecified site: Secondary | ICD-10-CM | POA: Diagnosis not present

## 2019-04-16 DIAGNOSIS — R7309 Other abnormal glucose: Secondary | ICD-10-CM | POA: Diagnosis not present

## 2019-04-16 DIAGNOSIS — I10 Essential (primary) hypertension: Secondary | ICD-10-CM | POA: Diagnosis not present

## 2019-04-16 DIAGNOSIS — Z0000 Encounter for general adult medical examination without abnormal findings: Secondary | ICD-10-CM | POA: Diagnosis not present

## 2019-06-03 DIAGNOSIS — K648 Other hemorrhoids: Secondary | ICD-10-CM | POA: Diagnosis not present

## 2019-06-03 DIAGNOSIS — K5900 Constipation, unspecified: Secondary | ICD-10-CM | POA: Diagnosis not present

## 2019-06-10 IMAGING — CT CT ABD-PELV W/ CM
2 of 5 series · 16 of 46 positions shown, 18 images · IV contrast (APPLIED)
Comparison: None.

CLINICAL DATA: 71-year-old female with abdominal pain, nausea
vomiting and diarrhea.

EXAM:
CT ABDOMEN AND PELVIS WITH CONTRAST
TECHNIQUE: Multidetector CT imaging of the abdomen and pelvis was performed
using the standard protocol following bolus administration of
intravenous contrast.
CONTRAST:  4D4JJK-6SS IOPAMIDOL (4D4JJK-6SS) INJECTION 61%

[Series 3: abdomen 5.0 · axial · 0.61mm/px · z∈[-362,-36]mm · 13 of 76 slices shown, 15 images]
[im 6/76  soft-tissue]
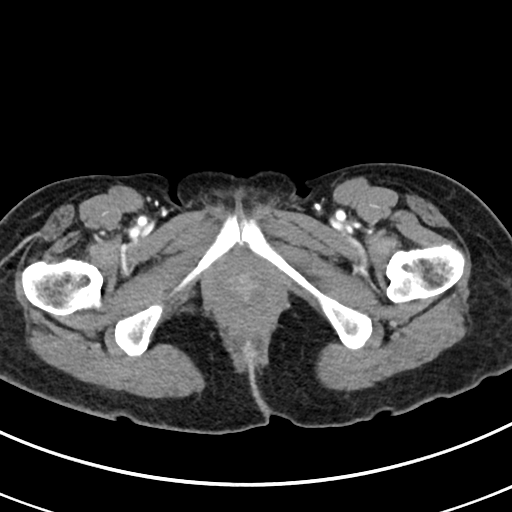
[im 6/76  bone]
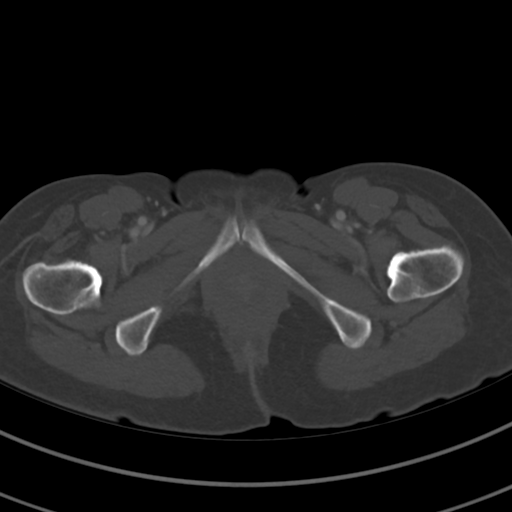
[im 11/76  soft-tissue]
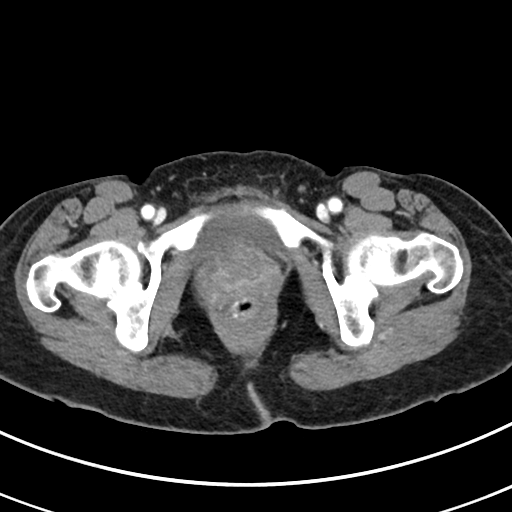
[im 16/76  soft-tissue]
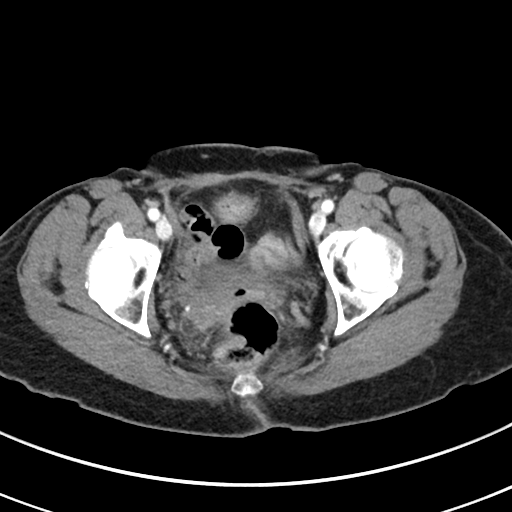
[im 21/76  soft-tissue]
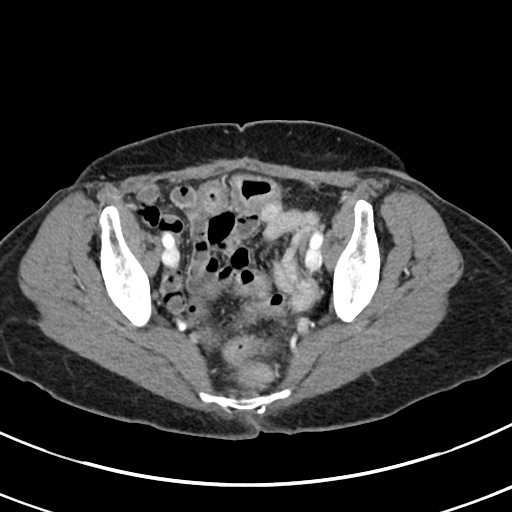
[im 26/76  soft-tissue]
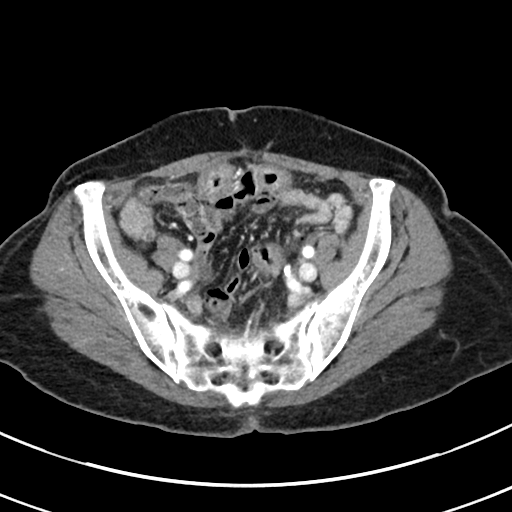
[im 31/76  soft-tissue]
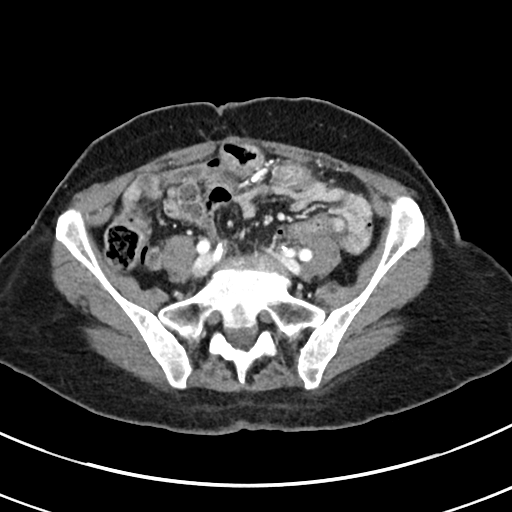
[im 41/76  soft-tissue]
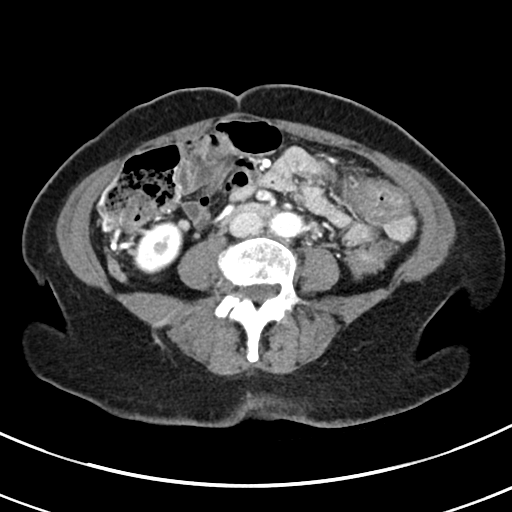
[im 46/76  soft-tissue]
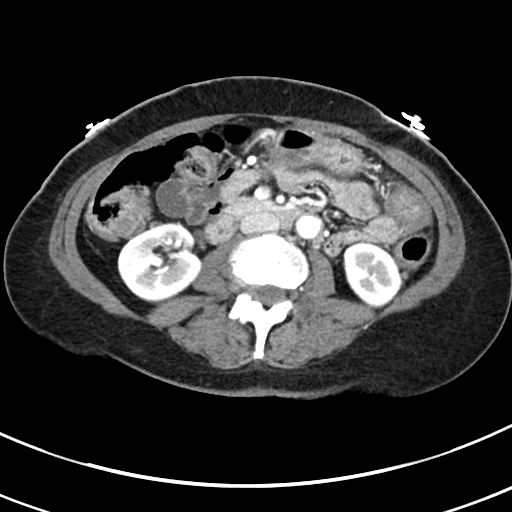
[im 51/76  soft-tissue]
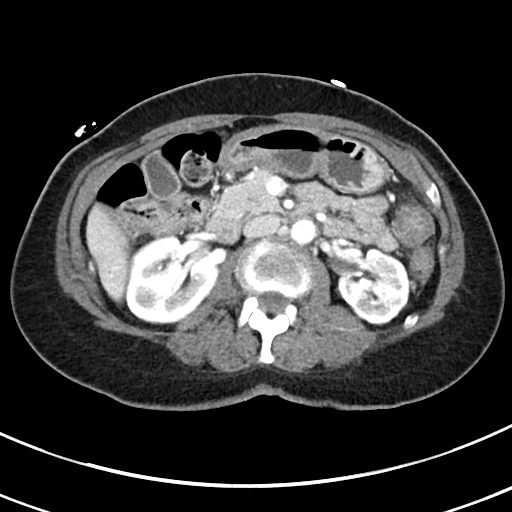
[im 51/76  bone]
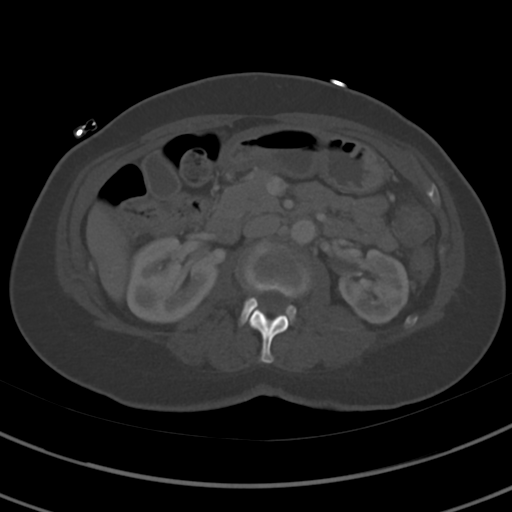
[im 56/76  soft-tissue]
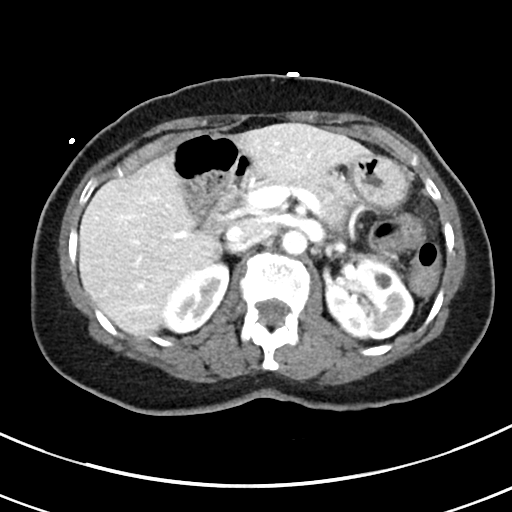
[im 61/76  soft-tissue]
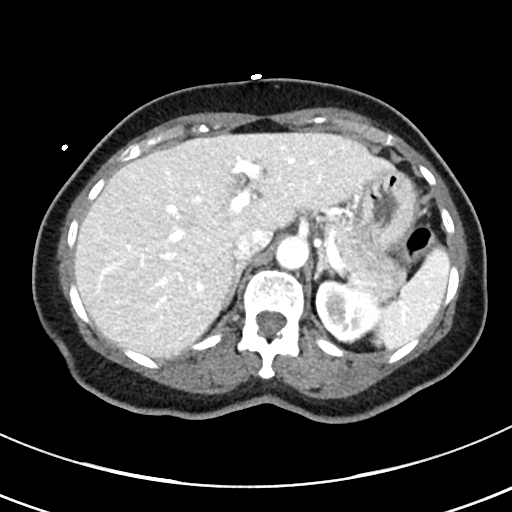
[im 66/76  soft-tissue]
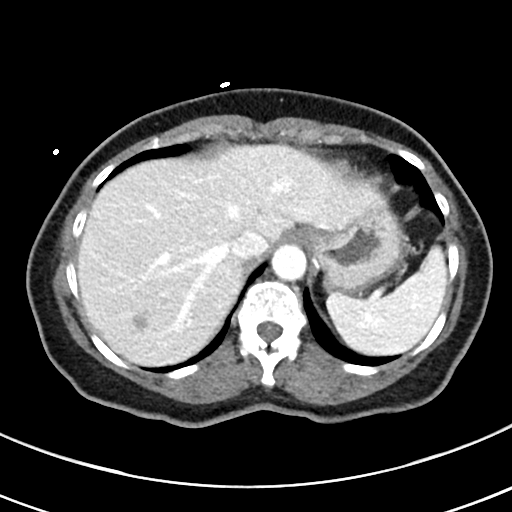
[im 71/76  soft-tissue]
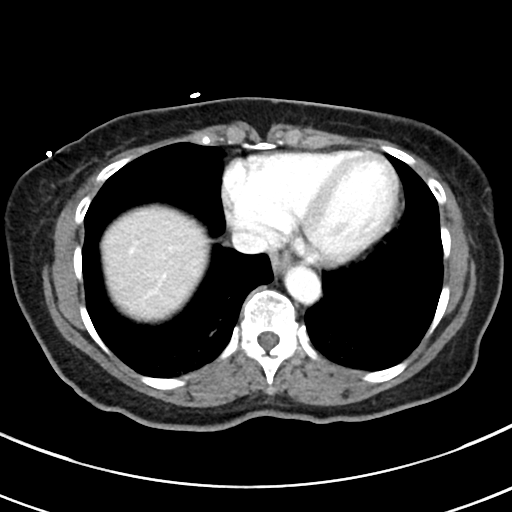

[Series 6: abdomen 3.0 mpr cor · coronal · 0.74mm/px · 3 of 78 slices shown]
[im 26/78  soft-tissue]
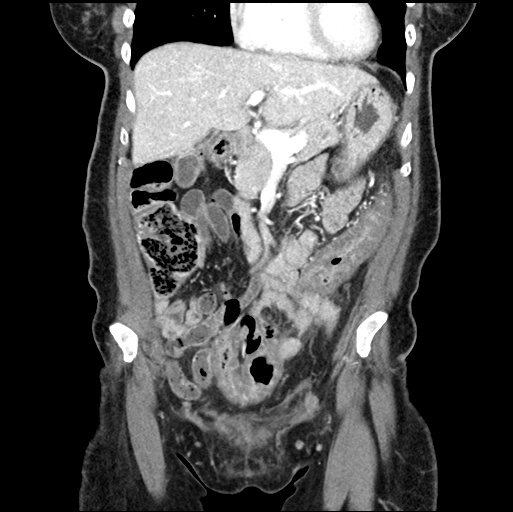
[im 35/78  soft-tissue]
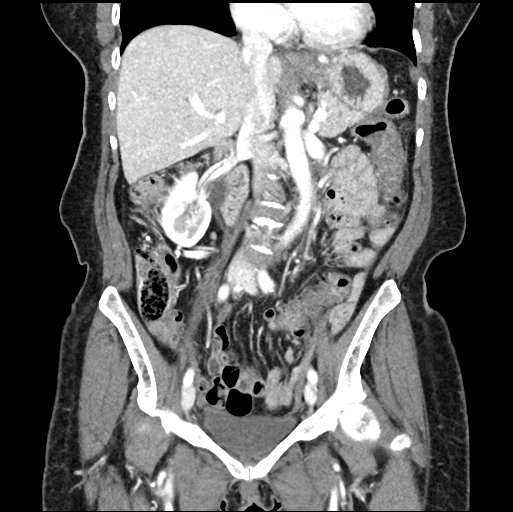
[im 43/78  soft-tissue]
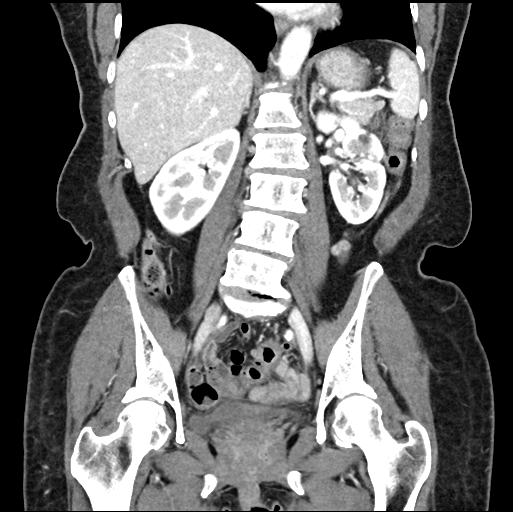

[16 of 46 positions shown; findings below may reference images not displayed]

FINDINGS: Lower chest: The visualized lung bases are clear.

No intra-abdominal free air or free fluid.

Hepatobiliary: A 12 mm low attenuating lesion in the right lobe of
the liver is not well characterized most likely represent a cyst or
hemangioma. MRI may provide better characterisation. There is no
intrahepatic biliary ductal dilatation. The gallbladder is
unremarkable.

Pancreas: Unremarkable. No pancreatic ductal dilatation or
surrounding inflammatory changes.

Spleen: Normal in size without focal abnormality.

Adrenals/Urinary Tract: The adrenal glands are unremarkable. There
is a 1 cm left renal cyst. Probable 3 mm nonobstructing left renal
interpolar stone. There is no hydronephrosis on either side. There
is symmetric enhancement and excretion of contrast by both kidneys.
The urinary bladder is only partially distended and appears grossly
unremarkable.

Stomach/Bowel: There scattered distal descending colon/ sigmoid
diverticula without active inflammatory changes. There is diffuse
thickening and inflammatory changes of the colon consistent with
colitis. Correlation with clinical exam and stool cultures
recommended. There is mild thickened appearance of the stomach which
may be related to underdistention or reactive changes of colitis.
There is no evidence of bowel obstruction. The appendix is normal.

Vascular/Lymphatic: The abdominal aorta and IVC appear unremarkable.
The origins of the celiac axis, SMA, IMA and the origins of the
renal arteries are patent. The SMV, splenic vein, and main portal
vein are patent. No portal venous gas identified. There is no
adenopathy.

Reproductive: Hysterectomy.  No pelvic masses.

Other: Midline vertical anterior abdominal wall incisional scar.

Musculoskeletal: Degenerative changes of the spine. No acute osseous
pathology.
IMPRESSION: 1. Colitis, likely infectious in etiology. Correlation with clinical
exam and stool cultures recommended. No bowel obstruction. Normal
appendix.
2. Sigmoid diverticulosis.

## 2019-06-17 DIAGNOSIS — I10 Essential (primary) hypertension: Secondary | ICD-10-CM | POA: Diagnosis not present

## 2019-06-17 DIAGNOSIS — K648 Other hemorrhoids: Secondary | ICD-10-CM | POA: Diagnosis not present

## 2019-06-17 DIAGNOSIS — R7309 Other abnormal glucose: Secondary | ICD-10-CM | POA: Diagnosis not present

## 2019-08-04 DIAGNOSIS — Z1231 Encounter for screening mammogram for malignant neoplasm of breast: Secondary | ICD-10-CM | POA: Diagnosis not present

## 2019-11-07 DIAGNOSIS — I10 Essential (primary) hypertension: Secondary | ICD-10-CM | POA: Diagnosis not present

## 2019-11-07 DIAGNOSIS — M130 Polyarthritis, unspecified: Secondary | ICD-10-CM | POA: Diagnosis not present

## 2019-11-10 ENCOUNTER — Ambulatory Visit: Payer: Medicare HMO | Attending: Internal Medicine

## 2019-11-10 DIAGNOSIS — Z23 Encounter for immunization: Secondary | ICD-10-CM | POA: Insufficient documentation

## 2019-11-10 NOTE — Progress Notes (Signed)
   Covid-19 Vaccination Clinic  Name:  Theresa Casey    MRN: [id number] DOB: 1945-11-15  11/10/2019  [name] was observed post Covid-19 immunization for 15 minutes without incidence. She was provided with Vaccine Information Sheet and instruction to access the V-Safe system.   [name] was instructed to call 911 with any severe reactions post vaccine: Marland Kitchen Difficulty breathing  . Swelling of your face and throat  . A fast heartbeat  . A bad rash all over your body  . Dizziness and weakness    Immunizations Administered    Name Date Dose VIS Date Route   Pfizer COVID-19 Vaccine 11/10/2019  3:56 PM 0.3 mL 09/05/2019 Intramuscular   Manufacturer: [employer], [employer]   Lot: [id number]   NDC: [address]-7

## 2019-12-02 ENCOUNTER — Ambulatory Visit: Payer: Medicare HMO | Attending: Internal Medicine

## 2019-12-02 DIAGNOSIS — Z23 Encounter for immunization: Secondary | ICD-10-CM

## 2019-12-02 NOTE — Progress Notes (Signed)
   Covid-19 Vaccination Clinic  Name:  Theresa Casey    MRN: [id number] DOB: 05-23-1946  12/02/2019  [name] was observed post Covid-19 immunization for 15 minutes without incident. She was provided with Vaccine Information Sheet and instruction to access the V-Safe system.   [name] was instructed to call 911 with any severe reactions post vaccine: Marland Kitchen Difficulty breathing  . Swelling of face and throat  . A fast heartbeat  . A bad rash all over body  . Dizziness and weakness   Immunizations Administered    Name Date Dose VIS Date Route   Pfizer COVID-19 Vaccine 12/02/2019  3:44 PM 0.3 mL 09/05/2019 Intramuscular   Manufacturer: [employer], [employer]   Lot: [id number]   NDC: [address]-1

## 2019-12-03 ENCOUNTER — Ambulatory Visit: Payer: Medicare HMO

## 2019-12-23 DIAGNOSIS — M130 Polyarthritis, unspecified: Secondary | ICD-10-CM | POA: Diagnosis not present

## 2019-12-23 DIAGNOSIS — R7301 Impaired fasting glucose: Secondary | ICD-10-CM | POA: Diagnosis not present

## 2019-12-23 DIAGNOSIS — I10 Essential (primary) hypertension: Secondary | ICD-10-CM | POA: Diagnosis not present

## 2019-12-23 DIAGNOSIS — H6123 Impacted cerumen, bilateral: Secondary | ICD-10-CM | POA: Diagnosis not present

## 2021-02-08 DIAGNOSIS — G5603 Carpal tunnel syndrome, bilateral upper limbs: Secondary | ICD-10-CM | POA: Diagnosis not present

## 2021-02-08 DIAGNOSIS — M654 Radial styloid tenosynovitis [de Quervain]: Secondary | ICD-10-CM | POA: Diagnosis not present

## 2021-03-17 DIAGNOSIS — R7301 Impaired fasting glucose: Secondary | ICD-10-CM | POA: Diagnosis not present

## 2021-03-17 DIAGNOSIS — R634 Abnormal weight loss: Secondary | ICD-10-CM | POA: Diagnosis not present

## 2021-03-17 DIAGNOSIS — Z681 Body mass index (BMI) 19 or less, adult: Secondary | ICD-10-CM | POA: Diagnosis not present

## 2021-03-17 DIAGNOSIS — M549 Dorsalgia, unspecified: Secondary | ICD-10-CM | POA: Diagnosis not present

## 2021-03-17 DIAGNOSIS — R7309 Other abnormal glucose: Secondary | ICD-10-CM | POA: Diagnosis not present

## 2021-03-17 DIAGNOSIS — M130 Polyarthritis, unspecified: Secondary | ICD-10-CM | POA: Diagnosis not present

## 2021-03-17 DIAGNOSIS — I10 Essential (primary) hypertension: Secondary | ICD-10-CM | POA: Diagnosis not present

## 2021-03-17 DIAGNOSIS — E1169 Type 2 diabetes mellitus with other specified complication: Secondary | ICD-10-CM | POA: Diagnosis not present

## 2021-04-15 ENCOUNTER — Other Ambulatory Visit (HOSPITAL_COMMUNITY)
Admission: RE | Admit: 2021-04-15 | Discharge: 2021-04-15 | Disposition: A | Discharge status: Home / Self Care | Payer: Medicare Other | Source: Ambulatory Visit | Attending: Family Medicine | Admitting: Family Medicine

## 2021-04-15 ENCOUNTER — Other Ambulatory Visit: Payer: Self-pay | Admitting: Family Medicine

## 2021-04-15 DIAGNOSIS — I10 Essential (primary) hypertension: Secondary | ICD-10-CM | POA: Diagnosis not present

## 2021-04-15 DIAGNOSIS — N898 Other specified noninflammatory disorders of vagina: Secondary | ICD-10-CM | POA: Diagnosis present

## 2021-04-15 DIAGNOSIS — R634 Abnormal weight loss: Secondary | ICD-10-CM | POA: Diagnosis not present

## 2021-04-15 DIAGNOSIS — N390 Urinary tract infection, site not specified: Secondary | ICD-10-CM | POA: Diagnosis not present

## 2021-04-19 LAB — MOLECULAR ANCILLARY ONLY
Bacterial Vaginitis (gardnerella): POSITIVE — AB
Candida Glabrata: NEGATIVE
Candida Vaginitis: NEGATIVE
Chlamydia: NEGATIVE
Comment: NEGATIVE
Comment: NEGATIVE
Comment: NEGATIVE
Comment: NEGATIVE
Comment: NEGATIVE
Comment: NORMAL
Neisseria Gonorrhea: NEGATIVE
Trichomonas: NEGATIVE

## 2021-04-29 DIAGNOSIS — I10 Essential (primary) hypertension: Secondary | ICD-10-CM | POA: Diagnosis not present

## 2021-05-13 DIAGNOSIS — H2513 Age-related nuclear cataract, bilateral: Secondary | ICD-10-CM | POA: Diagnosis not present

## 2021-06-10 DIAGNOSIS — I10 Essential (primary) hypertension: Secondary | ICD-10-CM | POA: Diagnosis not present

## 2021-06-10 DIAGNOSIS — Z681 Body mass index (BMI) 19 or less, adult: Secondary | ICD-10-CM | POA: Diagnosis not present

## 2021-06-10 DIAGNOSIS — R5383 Other fatigue: Secondary | ICD-10-CM | POA: Diagnosis not present

## 2021-06-10 DIAGNOSIS — R634 Abnormal weight loss: Secondary | ICD-10-CM | POA: Diagnosis not present

## 2021-07-25 DIAGNOSIS — M130 Polyarthritis, unspecified: Secondary | ICD-10-CM | POA: Diagnosis not present

## 2021-07-25 DIAGNOSIS — I10 Essential (primary) hypertension: Secondary | ICD-10-CM | POA: Diagnosis not present

## 2021-08-10 DIAGNOSIS — Z1231 Encounter for screening mammogram for malignant neoplasm of breast: Secondary | ICD-10-CM | POA: Diagnosis not present

## 2021-08-12 DIAGNOSIS — H00021 Hordeolum internum right upper eyelid: Secondary | ICD-10-CM | POA: Diagnosis not present

## 2021-10-14 DIAGNOSIS — Z681 Body mass index (BMI) 19 or less, adult: Secondary | ICD-10-CM | POA: Diagnosis not present

## 2021-10-14 DIAGNOSIS — I10 Essential (primary) hypertension: Secondary | ICD-10-CM | POA: Diagnosis not present

## 2021-10-14 DIAGNOSIS — M130 Polyarthritis, unspecified: Secondary | ICD-10-CM | POA: Diagnosis not present

## 2022-01-03 DIAGNOSIS — H04562 Stenosis of left lacrimal punctum: Secondary | ICD-10-CM | POA: Diagnosis not present

## 2022-01-03 DIAGNOSIS — H04202 Unspecified epiphora, left lacrimal gland: Secondary | ICD-10-CM | POA: Diagnosis not present

## 2022-01-19 ENCOUNTER — Encounter (HOSPITAL_COMMUNITY): Payer: Self-pay | Admitting: Optometry

## 2022-01-19 NOTE — Progress Notes (Signed)
Unable to reach patient via phone.  Left a detailed message on machine with instructions for DOS. ? ?TWO VISITORS ARE ALLOWED TO COME WITH YOU AND STAY IN THE SURGICAL WAITING ROOM ONLY DURING PRE OP AND PROCEDURE DAY OF SURGERY.  ? ?PCP - Dr Renaye Rakers ?Cardiologist - n/a ? ?Chest x-ray - n/a ?EKG - DOS ?Stress Test - n/a ?ECHO - n/a ?Cardiac Cath - n/a ? ?ICD Pacemaker/Loop - n/a ? ?Sleep Study -  n/a ?CPAP - none ? ?Anesthesia:  Yes ? ?STOP now taking any Aspirin (unless otherwise instructed by your surgeon), Aleve, Naproxen, Ibuprofen, Motrin, Advil, Goody's, BC's, all herbal medications, fish oil, and all vitamins.  ? ?

## 2022-01-20 ENCOUNTER — Other Ambulatory Visit: Payer: Self-pay

## 2022-01-20 ENCOUNTER — Encounter (HOSPITAL_COMMUNITY)
Admission: RE | Disposition: A | Discharge status: Home / Self Care | Payer: Self-pay | Source: Home / Self Care | Attending: Optometry

## 2022-01-20 ENCOUNTER — Encounter (HOSPITAL_COMMUNITY): Payer: Self-pay | Admitting: Optometry

## 2022-01-20 ENCOUNTER — Ambulatory Visit (HOSPITAL_COMMUNITY)
Admission: RE | Admit: 2022-01-20 | Discharge: 2022-01-20 | Disposition: A | Discharge status: Home / Self Care | Payer: Medicare (Managed Care) | Attending: Optometry | Admitting: Optometry

## 2022-01-20 ENCOUNTER — Ambulatory Visit (HOSPITAL_COMMUNITY): Payer: Medicare (Managed Care) | Admitting: Physician Assistant

## 2022-01-20 ENCOUNTER — Ambulatory Visit (HOSPITAL_BASED_OUTPATIENT_CLINIC_OR_DEPARTMENT_OTHER): Payer: Medicare (Managed Care) | Admitting: Physician Assistant

## 2022-01-20 DIAGNOSIS — M1990 Unspecified osteoarthritis, unspecified site: Secondary | ICD-10-CM | POA: Diagnosis not present

## 2022-01-20 DIAGNOSIS — H04562 Stenosis of left lacrimal punctum: Secondary | ICD-10-CM | POA: Diagnosis not present

## 2022-01-20 DIAGNOSIS — H04202 Unspecified epiphora, left lacrimal gland: Secondary | ICD-10-CM | POA: Insufficient documentation

## 2022-01-20 DIAGNOSIS — H04542 Stenosis of left lacrimal canaliculi: Secondary | ICD-10-CM | POA: Diagnosis not present

## 2022-01-20 DIAGNOSIS — H04552 Acquired stenosis of left nasolacrimal duct: Secondary | ICD-10-CM | POA: Diagnosis not present

## 2022-01-20 DIAGNOSIS — I10 Essential (primary) hypertension: Secondary | ICD-10-CM | POA: Diagnosis not present

## 2022-01-20 DIAGNOSIS — F17210 Nicotine dependence, cigarettes, uncomplicated: Secondary | ICD-10-CM | POA: Insufficient documentation

## 2022-01-20 HISTORY — DX: Unspecified cataract: H26.9

## 2022-01-20 HISTORY — PX: LACRIMAL DUCT EXPLORATION: SHX6569

## 2022-01-20 LAB — POCT I-STAT, CHEM 8
BUN: 9 mg/dL (ref 8–23)
Calcium, Ion: 1.06 mmol/L — ABNORMAL LOW (ref 1.15–1.40)
Chloride: 102 mmol/L (ref 98–111)
Creatinine, Ser: 0.6 mg/dL (ref 0.44–1.00)
Glucose, Bld: 87 mg/dL (ref 70–99)
HCT: 43 % (ref 36.0–46.0)
Hemoglobin: 14.6 g/dL (ref 12.0–15.0)
Potassium: 3.4 mmol/L — ABNORMAL LOW (ref 3.5–5.1)
Sodium: 140 mmol/L (ref 135–145)
TCO2: 28 mmol/L (ref 22–32)

## 2022-01-20 SURGERY — EXPLORATION, LACRIMAL DUCT
Anesthesia: General | Site: Eye | Laterality: Left

## 2022-01-20 MED ORDER — LIDOCAINE-EPINEPHRINE 1 %-1:100000 IJ SOLN
INTRAMUSCULAR | Status: DC | PRN
  Administered 2022-01-20: 10 mL

## 2022-01-20 MED ORDER — KETOROLAC TROMETHAMINE 15 MG/ML IJ SOLN: 15.0000 mg | Freq: Four times a day (QID) | INTRAMUSCULAR | Status: DC

## 2022-01-20 MED ORDER — ACETAMINOPHEN 500 MG PO TABS
1000.0000 mg | ORAL_TABLET | Freq: Once | ORAL | Status: AC
  Administered 2022-01-20: 1000 mg via ORAL
  Filled 2022-01-20: qty 2

## 2022-01-20 MED ORDER — PROPOFOL 10 MG/ML IV BOLUS
INTRAVENOUS | Status: AC
  Filled 2022-01-20: qty 20

## 2022-01-20 MED ORDER — BSS IO SOLN
INTRAOCULAR | Status: AC
  Filled 2022-01-20: qty 15

## 2022-01-20 MED ORDER — OXYCODONE HCL 5 MG PO TABS: 5.0000 mg | ORAL_TABLET | Freq: Once | ORAL | Status: DC | PRN

## 2022-01-20 MED ORDER — ACETAMINOPHEN 650 MG RE SUPP: 650.0000 mg | RECTAL | Status: DC | PRN

## 2022-01-20 MED ORDER — BUPIVACAINE HCL (PF) 0.75 % IJ SOLN
INTRAMUSCULAR | Status: AC
  Filled 2022-01-20: qty 10

## 2022-01-20 MED ORDER — EPHEDRINE 5 MG/ML INJ
INTRAVENOUS | Status: AC
  Filled 2022-01-20: qty 5

## 2022-01-20 MED ORDER — BSS IO SOLN
INTRAOCULAR | Status: DC | PRN
  Administered 2022-01-20: 15 mL via INTRAOCULAR

## 2022-01-20 MED ORDER — FLUORESCEIN SODIUM 1 MG OP STRP
ORAL_STRIP | OPHTHALMIC | Status: DC | PRN
  Administered 2022-01-20: 1 via OPHTHALMIC

## 2022-01-20 MED ORDER — SODIUM CHLORIDE 0.9% FLUSH: 3.0000 mL | INTRAVENOUS | Status: DC | PRN

## 2022-01-20 MED ORDER — ORAL CARE MOUTH RINSE: 15.0000 mL | Freq: Once | OROMUCOSAL | Status: AC

## 2022-01-20 MED ORDER — DEXAMETHASONE SODIUM PHOSPHATE 10 MG/ML IJ SOLN
INTRAMUSCULAR | Status: DC | PRN
  Administered 2022-01-20: 4 mg via INTRAVENOUS

## 2022-01-20 MED ORDER — FLUORESCEIN SODIUM 1 MG OP STRP
ORAL_STRIP | OPHTHALMIC | Status: AC
  Filled 2022-01-20: qty 1

## 2022-01-20 MED ORDER — DEXAMETHASONE SODIUM PHOSPHATE 10 MG/ML IJ SOLN
INTRAMUSCULAR | Status: AC
  Filled 2022-01-20: qty 1

## 2022-01-20 MED ORDER — EPHEDRINE SULFATE-NACL 50-0.9 MG/10ML-% IV SOSY: PREFILLED_SYRINGE | INTRAVENOUS | Status: DC | PRN

## 2022-01-20 MED ORDER — CHLORHEXIDINE GLUCONATE 0.12 % MT SOLN
15.0000 mL | Freq: Once | OROMUCOSAL | Status: AC
  Administered 2022-01-20: 15 mL via OROMUCOSAL
  Filled 2022-01-20: qty 15

## 2022-01-20 MED ORDER — OXYMETAZOLINE HCL 0.05 % NA SOLN
NASAL | Status: DC | PRN
  Administered 2022-01-20: 1 via TOPICAL

## 2022-01-20 MED ORDER — SODIUM CHLORIDE 0.9 % IV SOLN: 250.0000 mL | INTRAVENOUS | Status: DC | PRN

## 2022-01-20 MED ORDER — EPHEDRINE SULFATE-NACL 50-0.9 MG/10ML-% IV SOSY
PREFILLED_SYRINGE | INTRAVENOUS | Status: DC | PRN
  Administered 2022-01-20: 10 mg via INTRAVENOUS

## 2022-01-20 MED ORDER — TRIAMCINOLONE ACETONIDE 40 MG/ML IJ SUSP
INTRAMUSCULAR | Status: AC
  Filled 2022-01-20: qty 5

## 2022-01-20 MED ORDER — ONDANSETRON HCL 4 MG/2ML IJ SOLN: 4.0000 mg | Freq: Once | INTRAMUSCULAR | Status: DC | PRN

## 2022-01-20 MED ORDER — LIDOCAINE-EPINEPHRINE 1 %-1:100000 IJ SOLN
INTRAMUSCULAR | Status: AC
  Filled 2022-01-20: qty 1

## 2022-01-20 MED ORDER — FENTANYL CITRATE (PF) 250 MCG/5ML IJ SOLN
INTRAMUSCULAR | Status: AC
  Filled 2022-01-20: qty 5

## 2022-01-20 MED ORDER — LIDOCAINE 2% (20 MG/ML) 5 ML SYRINGE
INTRAMUSCULAR | Status: AC
  Filled 2022-01-20: qty 5

## 2022-01-20 MED ORDER — OXYCODONE HCL 5 MG/5ML PO SOLN: 5.0000 mg | Freq: Once | ORAL | Status: DC | PRN

## 2022-01-20 MED ORDER — 0.9 % SODIUM CHLORIDE (POUR BTL) OPTIME
TOPICAL | Status: DC | PRN
  Administered 2022-01-20: 1000 mL

## 2022-01-20 MED ORDER — OXYCODONE HCL 5 MG PO TABS: 5.0000 mg | ORAL_TABLET | ORAL | Status: DC | PRN

## 2022-01-20 MED ORDER — ACETAMINOPHEN 325 MG PO TABS: 650.0000 mg | ORAL_TABLET | ORAL | Status: DC | PRN

## 2022-01-20 MED ORDER — ACETAMINOPHEN 500 MG PO TABS: 1000.0000 mg | ORAL_TABLET | Freq: Four times a day (QID) | ORAL | Status: DC

## 2022-01-20 MED ORDER — ONDANSETRON HCL 4 MG/2ML IJ SOLN
INTRAMUSCULAR | Status: AC
  Filled 2022-01-20: qty 2

## 2022-01-20 MED ORDER — SODIUM CHLORIDE 0.9 % IV SOLN
INTRAVENOUS | Status: DC
Start: 2022-01-20 — End: 2022-01-20

## 2022-01-20 MED ORDER — AMISULPRIDE (ANTIEMETIC) 5 MG/2ML IV SOLN: 10.0000 mg | Freq: Once | INTRAVENOUS | Status: DC | PRN

## 2022-01-20 MED ORDER — OXYMETAZOLINE HCL 0.05 % NA SOLN
1.0000 | NASAL | Status: AC
  Administered 2022-01-20 (×3): 1 via NASAL
  Filled 2022-01-20: qty 30

## 2022-01-20 MED ORDER — FENTANYL CITRATE (PF) 250 MCG/5ML IJ SOLN
INTRAMUSCULAR | Status: DC | PRN
  Administered 2022-01-20: 50 ug via INTRAVENOUS
  Administered 2022-01-20 (×2): 25 ug via INTRAVENOUS

## 2022-01-20 MED ORDER — SODIUM CHLORIDE 0.9% FLUSH: 3.0000 mL | Freq: Two times a day (BID) | INTRAVENOUS | Status: DC

## 2022-01-20 MED ORDER — TRIAMCINOLONE ACETONIDE 40 MG/ML IJ SUSP
INTRAMUSCULAR | Status: DC | PRN
  Administered 2022-01-20: 40 mg

## 2022-01-20 MED ORDER — LIDOCAINE 2% (20 MG/ML) 5 ML SYRINGE
INTRAMUSCULAR | Status: DC | PRN
  Administered 2022-01-20: 60 mg via INTRAVENOUS

## 2022-01-20 MED ORDER — FENTANYL CITRATE (PF) 100 MCG/2ML IJ SOLN: 25.0000 ug | INTRAMUSCULAR | Status: DC | PRN

## 2022-01-20 MED ORDER — NEOMYCIN-POLYMYXIN-DEXAMETH 3.5-10000-0.1 OP SUSP
1.0000 [drp] | Freq: Once | OPHTHALMIC | Status: AC
  Administered 2022-01-20: 1 [drp] via OPHTHALMIC
  Filled 2022-01-20 (×2): qty 5

## 2022-01-20 MED ORDER — ERYTHROMYCIN 5 MG/GM OP OINT
1.0000 "application " | TOPICAL_OINTMENT | Freq: Once | OPHTHALMIC | Status: AC
  Administered 2022-01-20: 1 via OPHTHALMIC
  Filled 2022-01-20 (×2): qty 3.5

## 2022-01-20 MED ORDER — ONDANSETRON HCL 4 MG/2ML IJ SOLN
INTRAMUSCULAR | Status: DC | PRN
  Administered 2022-01-20: 4 mg via INTRAVENOUS

## 2022-01-20 MED ORDER — PROPOFOL 10 MG/ML IV BOLUS
INTRAVENOUS | Status: DC | PRN
  Administered 2022-01-20: 120 mg via INTRAVENOUS

## 2022-01-20 SURGICAL SUPPLY — 45 items
APL SWBSTK 6 STRL LF DISP (MISCELLANEOUS) ×2
APPLICATOR COTTON TIP 6 STRL (MISCELLANEOUS) ×2 IMPLANT
APPLICATOR COTTON TIP 6IN STRL (MISCELLANEOUS) ×3
BLADE SURG 15 STRL LF DISP TIS (BLADE) ×2 IMPLANT
BLADE SURG 15 STRL SS (BLADE) ×3
BUR DIAMOND COARSE 3.0 (BURR) IMPLANT
COAG SUCTION FOOTSWITCH 10FR (SUCTIONS) IMPLANT
COAGULATOR SUCT 8FR VV (MISCELLANEOUS) IMPLANT
CORD BIPOLAR FORCEPS 12FT (ELECTRODE) ×3 IMPLANT
COVER SURGICAL LIGHT HANDLE (MISCELLANEOUS) ×3 IMPLANT
DEFOGGER ANTIFOG KIT (MISCELLANEOUS) IMPLANT
DRAPE ORTHO SPLIT 77X108 STRL (DRAPES) ×3
DRAPE SURG ORHT 6 SPLT 77X108 (DRAPES) ×2 IMPLANT
ELECT NDL BLADE 2-5/6 (NEEDLE) IMPLANT
ELECT NEEDLE BLADE 2-5/6 (NEEDLE) IMPLANT
ELECT REM PT RETURN 9FT ADLT (ELECTROSURGICAL)
ELECTRODE REM PT RTRN 9FT ADLT (ELECTROSURGICAL) IMPLANT
FORCEPS BIPOLAR SPETZLER 8 1.0 (NEUROSURGERY SUPPLIES) ×3 IMPLANT
GAUZE 4X4 16PLY ~~LOC~~+RFID DBL (SPONGE) ×3 IMPLANT
GLOVE BIO SURGEON STRL SZ7.5 (GLOVE) ×4 IMPLANT
GLOVE SURG SYN 7.5  E (GLOVE) ×3
GLOVE SURG SYN 7.5 E (GLOVE) ×2 IMPLANT
GLOVE SURG SYN 7.5 PF PI (GLOVE) ×1 IMPLANT
GOWN STRL REUS W/ TWL LRG LVL3 (GOWN DISPOSABLE) ×3 IMPLANT
GOWN STRL REUS W/TWL LRG LVL3 (GOWN DISPOSABLE) ×3
KIT BASIN OR (CUSTOM PROCEDURE TRAY) ×3 IMPLANT
KNIFE CRESCENT 1.75 EDGEAHEAD (BLADE) ×3 IMPLANT
NDL PRECISIONGLIDE 27X1.5 (NEEDLE) ×2 IMPLANT
NEEDLE PRECISIONGLIDE 27X1.5 (NEEDLE) IMPLANT
NS IRRIG 1000ML POUR BTL (IV SOLUTION) ×3 IMPLANT
PACK CATARACT CUSTOM (CUSTOM PROCEDURE TRAY) ×3 IMPLANT
PAD ARMBOARD 7.5X6 YLW CONV (MISCELLANEOUS) ×6 IMPLANT
PATTIES SURGICAL .5 X3 (DISPOSABLE) ×3 IMPLANT
SET INTBT LACRIMAL .016X.025 (DRAIN) ×3 IMPLANT
SPONGE SURGIFOAM ABS GEL 12-7 (HEMOSTASIS) ×3 IMPLANT
STAPLER VISISTAT (STAPLE) IMPLANT
SUT CHROMIC 4 0 S 4 (SUTURE) IMPLANT
SUT PLAIN 5 0 P 3 18 (SUTURE) IMPLANT
SUT PROLENE 6 0 CC (SUTURE) IMPLANT
SUT PROLENE 6 0 P 3 18 (SUTURE) ×1 IMPLANT
SUT VICRYL 6 0 S 29 12 (SUTURE) IMPLANT
TOWEL GREEN STERILE FF (TOWEL DISPOSABLE) ×4 IMPLANT
TREPHINE OPHTH SISLER 21GA (BLADE) ×3 IMPLANT
TUBE CONNECTING 12X1/4 (SUCTIONS) ×3 IMPLANT
TUBING EXTENTION W/L.L. (IV SETS) ×2 IMPLANT

## 2022-01-20 NOTE — Interval H&P Note (Signed)
History and Physical Interval Note: ? ?01/20/2022 ?1:17 PM ? ?Theresa Casey  has presented today for surgery, with the diagnosis of ACQUIRED STENOSIS OF LEFT LACRIMAL PUNCTUM.  The various methods of treatment have been discussed with the patient and family. After consideration of risks, benefits and other options for treatment, the patient has consented to  Procedure(s): ?LACRIMAL DUCT EXPLORATION (Left) ?DACROCYSTORHINOSTOMY (DCR) ;POSSIBLE ENDOSCOPIC; POSSIBLE JONES TUBE DCR; POSSIBLE PLASTIC OF CANALICULI (Left) ?PUNCTOPLASTY (Left) as a surgical intervention.  The patient's history has been reviewed, patient examined, no change in status, stable for surgery.  I have reviewed the patient's chart and labs.  Questions were answered to the patient's satisfaction.   ? ? ?[name] ? ? ?

## 2022-01-20 NOTE — Op Note (Signed)
Operative Note ?PATIENT NAME:  Theresa Casey ?HOSPITAL NAME:  [id number] ?DATE OF SERVICE:  01/20/2022  ?DATE OF BIRTH:  1945/12/02 ? ?PREOPERATIVE DIAGNOSIS:   ?1. Nasolacrimal duct obstruction, left ?2. Epiphora, left ?3. Punctal stenosis, left upper and left lower eyelid ? ?POSTOPERATIVE DIAGNOSIS:   ?1. Nasolacrimal duct obstruction, left ?2. Epiphora, left ?3. Punctal stenosis, left upper and left lower eyelid ?4. Canalicular obstruction, left upper eyelid ? ?PROCEDURE(S) PERFORMED:  ?1. Punctoplasty, left upper lid CPT 68440 ?2. Punctoplasty, left lower lid CPT 68440 ?3. Nasolacrimal duct probe with placement of Crawford stent, left CPT [phone] ?4. Plastic repair canaliculi, left upper CPT [phone] ?5. Dilation and irrigation, left upper and lower canaliculi  ? ?SURGEON:  Delia Chimes, MD, PhD ?ASSISTANT:  none ? ?ANESTHESIA:  General ? ?FLUIDS GIVEN: Per Anesthesia  ? ?ESTIMATED BLOOD LOSS:  <1 cc ? ?TUBES/DRAINS:  None ? ?SPECIMENS/CULTURES: none  ? ?IMPLANTS: Crawford nasolacrimal stent, left  ? ?COMPLICATIONS:  None ? ?DESCRIPTION OF PROCEDURE: After obtaining informed consent, the patient was taken back to the operating room and laid supine on the operating room table.  Under monitored anesthesia care, the operative site(s) were anesthetized with 2% lidocaine with epinephrine and bupivicaine.  The nares were packed with Afrin-soaked pledgets.  The patient was then prepped and draped in the usual sterile fashion.  ? ?The left lower eyelid was approached first.  A punctal dilator was used to dilate the stenotic punctum.  The medial and posterior aspect of the punctum was grasped with a 0.2 mm forceps.  A 2 snip punctoplasty procedure was then performed with sharp--tipped Wescott scissors.  This procedure was then repeated for the left upper eyelid.  The punctum was then again dilated and a double lobe Bowman probe passed to a hard stop in the left lower eyelid.  A 00 Bowman probe met resistance during  passage in the left upper canaliculus, indicative of canalicular obstruction.  Thus, a plastic repair of canaliculi was performed using a Sisler trephine.  The Sisler lacrimal trephine was introduced into the dilated punctum and advanced to a hard stop.  The trocar was withdrawn and with successive rotary movements, the left upper canaliculus was trephinated.  To confirm patency of the nasolacrimal duct, a Shanahan lacrimal cannula was introduced into the left upper and lower canaliculi and fluorescein-stained saline solution was irrigated and retrieved in the nose with suction. Next, a Crawford stent was introduced into the left upper canaliculus and was attempted to be passed into the nasolacrimal system. After several attempts, the this was abandoned as the nasolacrimal duct was unable to be stented in this fashion.  Next, a pigtail probe was introduced from the lower canaliculus into the upper canaliculus.  A 6-0 Prolene suture was passed using the pigtail probe and then the pigtail probe removed from the canalicular system.  One of the metal introducers was removed from the [place] stent and the Crawford stent tied to the Prolene suture entering the lower canaliculus.  The lower canaliculus was again dilated with a punctal dilator and gentle traction used to advance the Crawford stent through the lacrimal sac and emerging from the left upper canaliculus.  The other metal introducer was removed and a gentle 1-1 knot was tied in the silicone stent, thus providing an annular bicanalicular stent.  The knot was rotated into the nasolacrimal sac through the lower canaliculus after dilating the left lower punctum. ?A drop of Maxitrol was placed in the operative eye. Ointment was placed  in the eye.  ? ?The patient was then taken to the recovery room in stable condition.  ? ?Delia Chimes, MD, PhD ?Ophthalmic Plastic and Reconstructive Surgery ?[employer] ?[phone] (office) ? ?

## 2022-01-20 NOTE — Transfer of Care (Signed)
Immediate Anesthesia Transfer of Care Note ? ?Patient: YOVANNA COGAN ? ?Procedure(s) Performed: NASAL LACRIMAL DUCT PROBE WITH STENT PLACMENT (Left: Eye) ?LEFT UPPER AND LOWER LID PUNCTOPLASTY AND LEFT UPPER PLASTIC REPAIR OF CANALICULI (Left: Eye) ? ?Patient Location: PACU ? ?Anesthesia Type:General ? ?Level of Consciousness: drowsy, patient cooperative and responds to stimulation ? ?Airway & Oxygen Therapy: Patient Spontanous Breathing ? ?Post-op Assessment: Report given to RN and Post -op Vital signs reviewed and stable ? ?Post vital signs: Reviewed and stable ? ?Last Vitals:  ?Vitals Value Taken Time  ?BP 178/80 01/20/22 1717  ?Temp 36.1 ?C 01/20/22 1717  ?Pulse 62 01/20/22 1720  ?Resp 15 01/20/22 1720  ?SpO2 100 % 01/20/22 1720  ?Vitals shown include unvalidated device data. ? ?Last Pain:  ?Vitals:  ? 01/20/22 1717  ?TempSrc:   ?PainSc: 0-No pain  ?   ? ?  ? ?Complications: No notable events documented. ?

## 2022-01-20 NOTE — Anesthesia Procedure Notes (Signed)
Procedure Name: LMA Insertion ?Date/Time: 01/20/2022 3:55 PM ?Performed by: Lovie Chol, CRNA ?Pre-anesthesia Checklist: Patient identified, Emergency Drugs available, Suction available and Patient being monitored ?Patient Re-evaluated:Patient Re-evaluated prior to induction ?Oxygen Delivery Method: Circle System Utilized ?Preoxygenation: Pre-oxygenation with 100% oxygen ?Induction Type: IV induction ?Ventilation: Mask ventilation without difficulty ?LMA: LMA inserted ?LMA Size: 4.0 ?Number of attempts: 1 ?Placement Confirmation: positive ETCO2 and breath sounds checked- equal and bilateral ?Tube secured with: Tape ?Dental Injury: Teeth and Oropharynx as per pre-operative assessment  ?Comments: Smooth IV induction. Easy mask. LMA 3 placed with ease. No ETCO2 despite several attempts to reposition. LMA 3 out. Patient masked for several breaths. LMA 4 placed with ease. ETCO2 BBSE. ? ? ? ? ?

## 2022-01-20 NOTE — Anesthesia Preprocedure Evaluation (Addendum)
Anesthesia Evaluation  ?Patient identified by MRN, date of birth, ID band ?Patient awake ? ? ? ?Reviewed: ?Allergy & Precautions, NPO status , Patient's Chart, lab work & pertinent test results ? ?Airway ?Mallampati: II ? ?TM Distance: >3 FB ?Neck ROM: Full ? ? ? Dental ?no notable dental hx. ?(+) Dental Advisory Given ?  ?Pulmonary ?neg pulmonary ROS, Current Smoker,  ?  ?Pulmonary exam normal ?breath sounds clear to auscultation ? ? ? ? ? ? Cardiovascular ?hypertension, Pt. on medications and Pt. on home beta blockers ?negative cardio ROS ?Normal cardiovascular exam ?Rhythm:Regular Rate:Normal ? ? ?  ?Neuro/Psych ?negative neurological ROS ? negative psych ROS  ? GI/Hepatic ?negative GI ROS, Neg liver ROS,   ?Endo/Other  ?negative endocrine ROS ? Renal/GU ?negative Renal ROS  ?negative genitourinary ?  ?Musculoskeletal ?negative musculoskeletal ROS ?(+) Arthritis ,  ? Abdominal ?  ?Peds ?negative pediatric ROS ?(+)  Hematology ?negative hematology ROS ?(+)   ?Anesthesia Other Findings ? ? Reproductive/Obstetrics ?negative OB ROS ? ?  ? ? ? ? ? ? ? ? ? ? ? ? ? ?  ?  ? ? ? ? ? ? ?Anesthesia Physical ?Anesthesia Plan ? ?ASA: 2 ? ?Anesthesia Plan: General  ? ?Post-op Pain Management: Tylenol PO (pre-op)*  ? ?Induction: Intravenous ? ?PONV Risk Score and Plan: 2 and Treatment may vary due to age or medical condition, Ondansetron, Dexamethasone and Midazolam ? ?Airway Management Planned: LMA ? ?Additional Equipment: None ? ?Intra-op Plan:  ? ?Post-operative Plan: Extubation in OR ? ?Informed Consent: I have reviewed the patients History and Physical, chart, labs and discussed the procedure including the risks, benefits and alternatives for the proposed anesthesia with the patient or authorized representative who has indicated his/her understanding and acceptance.  ? ? ? ? ? ?Plan Discussed with: Anesthesiologist and CRNA ? ?Anesthesia Plan Comments:   ? ? ? ? ? ? ?Anesthesia Quick  Evaluation ? ?

## 2022-01-20 NOTE — Discharge Instructions (Addendum)
Medicines: ?Maxitrol (neomycin/polymyxin/dexamethasone): Use 4 times per day in the left eye ?Erythromycin ophthalmic ointment: Apply three times daily as needed for any irritation ?Afrin: Use as needed for any nosebleeds ? ?Activities: ?No lifting, bending, or straining more than 8 pounds for 1 week ?You may resume any blood thinners on the third day after surgery ? ?

## 2022-01-21 NOTE — Anesthesia Postprocedure Evaluation (Signed)
Anesthesia Post Note ? ?Patient: Theresa Casey ? ?Procedure(s) Performed: NASAL LACRIMAL DUCT PROBE WITH STENT PLACMENT (Left: Eye) ?LEFT UPPER AND LOWER LID PUNCTOPLASTY AND LEFT UPPER PLASTIC REPAIR OF CANALICULI (Left: Eye) ? ?  ? ?Patient location during evaluation: PACU ?Anesthesia Type: General ?Level of consciousness: awake and alert ?Pain management: pain level controlled ?Vital Signs Assessment: post-procedure vital signs reviewed and stable ?Respiratory status: spontaneous breathing, nonlabored ventilation, respiratory function stable and patient connected to nasal cannula oxygen ?Cardiovascular status: blood pressure returned to baseline and stable ?Postop Assessment: no apparent nausea or vomiting ?Anesthetic complications: no ? ? ?No notable events documented. ? ?Last Vitals:  ?Vitals:  ? 01/20/22 1731 01/20/22 1745  ?BP: (!) 154/87 (!) 157/81  ?Pulse: (!) 55 (!) 57  ?Resp: 10 12  ?Temp:  (!) 36.1 ?C  ?SpO2: 98% 94%  ?  ?Last Pain:  ?Vitals:  ? 01/20/22 1745  ?TempSrc:   ?PainSc: 0-No pain  ? ? ?  ?  ?  ?  ?  ?  ? ?Theresa Casey ? ? ? ? ?

## 2022-01-22 ENCOUNTER — Encounter (HOSPITAL_COMMUNITY): Payer: Self-pay | Admitting: Optometry

## 2022-01-24 DIAGNOSIS — I10 Essential (primary) hypertension: Secondary | ICD-10-CM | POA: Diagnosis not present

## 2022-01-24 DIAGNOSIS — R7309 Other abnormal glucose: Secondary | ICD-10-CM | POA: Diagnosis not present

## 2022-01-24 DIAGNOSIS — M810 Age-related osteoporosis without current pathological fracture: Secondary | ICD-10-CM | POA: Diagnosis not present

## 2022-04-25 DIAGNOSIS — R634 Abnormal weight loss: Secondary | ICD-10-CM | POA: Diagnosis not present

## 2022-04-25 DIAGNOSIS — H9319 Tinnitus, unspecified ear: Secondary | ICD-10-CM | POA: Diagnosis not present

## 2022-04-25 DIAGNOSIS — M5430 Sciatica, unspecified side: Secondary | ICD-10-CM | POA: Diagnosis not present

## 2022-05-02 DIAGNOSIS — H209 Unspecified iridocyclitis: Secondary | ICD-10-CM | POA: Diagnosis not present

## 2022-06-28 DIAGNOSIS — H524 Presbyopia: Secondary | ICD-10-CM | POA: Diagnosis not present

## 2022-06-28 DIAGNOSIS — Z0100 Encounter for examination of eyes and vision without abnormal findings: Secondary | ICD-10-CM | POA: Diagnosis not present

## 2022-07-03 DIAGNOSIS — M5416 Radiculopathy, lumbar region: Secondary | ICD-10-CM | POA: Diagnosis not present

## 2022-07-03 DIAGNOSIS — M4316 Spondylolisthesis, lumbar region: Secondary | ICD-10-CM | POA: Diagnosis not present

## 2022-07-13 DIAGNOSIS — M5416 Radiculopathy, lumbar region: Secondary | ICD-10-CM | POA: Diagnosis not present

## 2022-08-14 DIAGNOSIS — Z681 Body mass index (BMI) 19 or less, adult: Secondary | ICD-10-CM | POA: Diagnosis not present

## 2022-08-14 DIAGNOSIS — L601 Onycholysis: Secondary | ICD-10-CM | POA: Diagnosis not present

## 2022-08-14 DIAGNOSIS — I10 Essential (primary) hypertension: Secondary | ICD-10-CM | POA: Diagnosis not present

## 2022-08-14 DIAGNOSIS — R7309 Other abnormal glucose: Secondary | ICD-10-CM | POA: Diagnosis not present

## 2022-08-16 DIAGNOSIS — Z1231 Encounter for screening mammogram for malignant neoplasm of breast: Secondary | ICD-10-CM | POA: Diagnosis not present

## 2022-08-22 DIAGNOSIS — M4316 Spondylolisthesis, lumbar region: Secondary | ICD-10-CM | POA: Diagnosis not present

## 2022-08-22 DIAGNOSIS — M5416 Radiculopathy, lumbar region: Secondary | ICD-10-CM | POA: Diagnosis not present

## 2022-09-11 DIAGNOSIS — Z780 Asymptomatic menopausal state: Secondary | ICD-10-CM | POA: Diagnosis not present

## 2022-09-11 DIAGNOSIS — M810 Age-related osteoporosis without current pathological fracture: Secondary | ICD-10-CM | POA: Diagnosis not present

## 2022-10-16 DIAGNOSIS — M810 Age-related osteoporosis without current pathological fracture: Secondary | ICD-10-CM | POA: Diagnosis not present

## 2022-10-16 DIAGNOSIS — I10 Essential (primary) hypertension: Secondary | ICD-10-CM | POA: Diagnosis not present

## 2022-10-16 DIAGNOSIS — R634 Abnormal weight loss: Secondary | ICD-10-CM | POA: Diagnosis not present

## 2022-10-16 DIAGNOSIS — L601 Onycholysis: Secondary | ICD-10-CM | POA: Diagnosis not present

## 2022-11-15 DIAGNOSIS — M654 Radial styloid tenosynovitis [de Quervain]: Secondary | ICD-10-CM | POA: Diagnosis not present

## 2022-11-21 DIAGNOSIS — M654 Radial styloid tenosynovitis [de Quervain]: Secondary | ICD-10-CM | POA: Diagnosis not present

## 2022-12-15 DIAGNOSIS — R634 Abnormal weight loss: Secondary | ICD-10-CM | POA: Diagnosis not present

## 2022-12-15 DIAGNOSIS — Q782 Osteopetrosis: Secondary | ICD-10-CM | POA: Diagnosis not present

## 2022-12-15 DIAGNOSIS — R7309 Other abnormal glucose: Secondary | ICD-10-CM | POA: Diagnosis not present

## 2022-12-15 DIAGNOSIS — I10 Essential (primary) hypertension: Secondary | ICD-10-CM | POA: Diagnosis not present

## 2022-12-15 DIAGNOSIS — M1250 Traumatic arthropathy, unspecified site: Secondary | ICD-10-CM | POA: Diagnosis not present

## 2022-12-15 DIAGNOSIS — R7303 Prediabetes: Secondary | ICD-10-CM | POA: Diagnosis not present

## 2023-02-02 DIAGNOSIS — R634 Abnormal weight loss: Secondary | ICD-10-CM | POA: Diagnosis not present

## 2023-02-02 DIAGNOSIS — I10 Essential (primary) hypertension: Secondary | ICD-10-CM | POA: Diagnosis not present

## 2023-02-02 DIAGNOSIS — R5383 Other fatigue: Secondary | ICD-10-CM | POA: Diagnosis not present

## 2023-04-24 DIAGNOSIS — M130 Polyarthritis, unspecified: Secondary | ICD-10-CM | POA: Diagnosis not present

## 2023-04-24 DIAGNOSIS — E440 Moderate protein-calorie malnutrition: Secondary | ICD-10-CM | POA: Diagnosis not present

## 2023-04-24 DIAGNOSIS — M75100 Unspecified rotator cuff tear or rupture of unspecified shoulder, not specified as traumatic: Secondary | ICD-10-CM | POA: Diagnosis not present

## 2023-04-24 DIAGNOSIS — I10 Essential (primary) hypertension: Secondary | ICD-10-CM | POA: Diagnosis not present

## 2023-04-24 DIAGNOSIS — R7303 Prediabetes: Secondary | ICD-10-CM | POA: Diagnosis not present

## 2023-04-24 DIAGNOSIS — Q782 Osteopetrosis: Secondary | ICD-10-CM | POA: Diagnosis not present

## 2023-05-01 DIAGNOSIS — M7581 Other shoulder lesions, right shoulder: Secondary | ICD-10-CM | POA: Diagnosis not present

## 2023-05-01 DIAGNOSIS — M654 Radial styloid tenosynovitis [de Quervain]: Secondary | ICD-10-CM | POA: Diagnosis not present

## 2023-06-12 DIAGNOSIS — M7581 Other shoulder lesions, right shoulder: Secondary | ICD-10-CM | POA: Diagnosis not present

## 2023-06-13 DIAGNOSIS — H35372 Puckering of macula, left eye: Secondary | ICD-10-CM | POA: Diagnosis not present

## 2023-06-20 DIAGNOSIS — S46011D Strain of muscle(s) and tendon(s) of the rotator cuff of right shoulder, subsequent encounter: Secondary | ICD-10-CM | POA: Diagnosis not present

## 2023-06-27 DIAGNOSIS — S46011D Strain of muscle(s) and tendon(s) of the rotator cuff of right shoulder, subsequent encounter: Secondary | ICD-10-CM | POA: Diagnosis not present

## 2023-07-16 DIAGNOSIS — Q782 Osteopetrosis: Secondary | ICD-10-CM | POA: Diagnosis not present

## 2023-07-16 DIAGNOSIS — I10 Essential (primary) hypertension: Secondary | ICD-10-CM | POA: Diagnosis not present

## 2023-07-16 DIAGNOSIS — M130 Polyarthritis, unspecified: Secondary | ICD-10-CM | POA: Diagnosis not present

## 2023-08-22 DIAGNOSIS — Z1231 Encounter for screening mammogram for malignant neoplasm of breast: Secondary | ICD-10-CM | POA: Diagnosis not present

## 2023-10-22 DIAGNOSIS — R7301 Impaired fasting glucose: Secondary | ICD-10-CM | POA: Diagnosis not present

## 2023-10-22 DIAGNOSIS — M1250 Traumatic arthropathy, unspecified site: Secondary | ICD-10-CM | POA: Diagnosis not present

## 2023-10-22 DIAGNOSIS — M549 Dorsalgia, unspecified: Secondary | ICD-10-CM | POA: Diagnosis not present

## 2023-10-22 DIAGNOSIS — I10 Essential (primary) hypertension: Secondary | ICD-10-CM | POA: Diagnosis not present

## 2023-10-22 DIAGNOSIS — R413 Other amnesia: Secondary | ICD-10-CM | POA: Diagnosis not present

## 2023-10-22 DIAGNOSIS — Z6820 Body mass index (BMI) 20.0-20.9, adult: Secondary | ICD-10-CM | POA: Diagnosis not present

## 2023-12-03 DIAGNOSIS — M810 Age-related osteoporosis without current pathological fracture: Secondary | ICD-10-CM | POA: Diagnosis not present

## 2023-12-03 DIAGNOSIS — R634 Abnormal weight loss: Secondary | ICD-10-CM | POA: Diagnosis not present

## 2023-12-03 DIAGNOSIS — I10 Essential (primary) hypertension: Secondary | ICD-10-CM | POA: Diagnosis not present

## 2024-02-04 DIAGNOSIS — R413 Other amnesia: Secondary | ICD-10-CM | POA: Diagnosis not present

## 2024-02-04 DIAGNOSIS — M810 Age-related osteoporosis without current pathological fracture: Secondary | ICD-10-CM | POA: Diagnosis not present

## 2024-02-04 DIAGNOSIS — I10 Essential (primary) hypertension: Secondary | ICD-10-CM | POA: Diagnosis not present

## 2024-02-04 DIAGNOSIS — Z0000 Encounter for general adult medical examination without abnormal findings: Secondary | ICD-10-CM | POA: Diagnosis not present

## 2024-02-04 DIAGNOSIS — E440 Moderate protein-calorie malnutrition: Secondary | ICD-10-CM | POA: Diagnosis not present

## 2024-02-18 DIAGNOSIS — Z1211 Encounter for screening for malignant neoplasm of colon: Secondary | ICD-10-CM | POA: Diagnosis not present

## 2024-02-18 DIAGNOSIS — Z1212 Encounter for screening for malignant neoplasm of rectum: Secondary | ICD-10-CM | POA: Diagnosis not present

## 2024-02-26 DIAGNOSIS — Z860100 Personal history of colon polyps, unspecified: Secondary | ICD-10-CM | POA: Diagnosis not present

## 2024-02-26 DIAGNOSIS — K5730 Diverticulosis of large intestine without perforation or abscess without bleeding: Secondary | ICD-10-CM | POA: Diagnosis not present

## 2024-02-26 DIAGNOSIS — Z1211 Encounter for screening for malignant neoplasm of colon: Secondary | ICD-10-CM | POA: Diagnosis not present

## 2024-02-26 DIAGNOSIS — K625 Hemorrhage of anus and rectum: Secondary | ICD-10-CM | POA: Diagnosis not present

## 2024-03-10 DIAGNOSIS — Z860101 Personal history of adenomatous and serrated colon polyps: Secondary | ICD-10-CM | POA: Diagnosis not present

## 2024-03-10 DIAGNOSIS — Z538 Procedure and treatment not carried out for other reasons: Secondary | ICD-10-CM | POA: Diagnosis not present

## 2024-03-10 DIAGNOSIS — Z860100 Personal history of colon polyps, unspecified: Secondary | ICD-10-CM | POA: Diagnosis not present

## 2024-03-10 DIAGNOSIS — K5730 Diverticulosis of large intestine without perforation or abscess without bleeding: Secondary | ICD-10-CM | POA: Diagnosis not present

## 2024-03-10 DIAGNOSIS — Q438 Other specified congenital malformations of intestine: Secondary | ICD-10-CM | POA: Diagnosis not present

## 2024-03-10 DIAGNOSIS — Z1211 Encounter for screening for malignant neoplasm of colon: Secondary | ICD-10-CM | POA: Diagnosis not present

## 2024-03-10 DIAGNOSIS — K625 Hemorrhage of anus and rectum: Secondary | ICD-10-CM | POA: Diagnosis not present

## 2024-03-12 ENCOUNTER — Other Ambulatory Visit: Payer: Self-pay | Admitting: Gastroenterology

## 2024-03-12 DIAGNOSIS — Z1211 Encounter for screening for malignant neoplasm of colon: Secondary | ICD-10-CM

## 2024-04-07 ENCOUNTER — Encounter: Payer: Self-pay | Admitting: Gastroenterology

## 2024-04-08 ENCOUNTER — Ambulatory Visit
Admission: RE | Admit: 2024-04-08 | Discharge: 2024-04-08 | Disposition: A | Discharge status: Home / Self Care | Source: Ambulatory Visit | Attending: Gastroenterology | Admitting: Gastroenterology

## 2024-04-08 DIAGNOSIS — Z1211 Encounter for screening for malignant neoplasm of colon: Secondary | ICD-10-CM | POA: Diagnosis not present

## 2024-04-28 DIAGNOSIS — I10 Essential (primary) hypertension: Secondary | ICD-10-CM | POA: Diagnosis not present

## 2024-04-28 DIAGNOSIS — R634 Abnormal weight loss: Secondary | ICD-10-CM | POA: Diagnosis not present

## 2024-06-03 DIAGNOSIS — M19011 Primary osteoarthritis, right shoulder: Secondary | ICD-10-CM | POA: Diagnosis not present

## 2024-06-11 DIAGNOSIS — H26493 Other secondary cataract, bilateral: Secondary | ICD-10-CM | POA: Diagnosis not present

## 2024-06-12 DIAGNOSIS — R413 Other amnesia: Secondary | ICD-10-CM | POA: Diagnosis not present

## 2024-06-12 DIAGNOSIS — I10 Essential (primary) hypertension: Secondary | ICD-10-CM | POA: Diagnosis not present

## 2024-06-12 DIAGNOSIS — M1250 Traumatic arthropathy, unspecified site: Secondary | ICD-10-CM | POA: Diagnosis not present

## 2024-06-12 DIAGNOSIS — R634 Abnormal weight loss: Secondary | ICD-10-CM | POA: Diagnosis not present

## 2024-07-15 ENCOUNTER — Other Ambulatory Visit: Payer: Self-pay | Admitting: Orthopaedic Surgery

## 2024-07-15 DIAGNOSIS — M19011 Primary osteoarthritis, right shoulder: Secondary | ICD-10-CM

## 2024-07-15 DIAGNOSIS — M25511 Pain in right shoulder: Secondary | ICD-10-CM | POA: Diagnosis not present

## 2024-07-22 ENCOUNTER — Encounter: Payer: Self-pay | Admitting: Orthopaedic Surgery

## 2024-07-23 ENCOUNTER — Emergency Department (HOSPITAL_COMMUNITY)
Admission: EM | Admit: 2024-07-23 | Discharge: 2024-07-24 | Discharge status: Against Medical Advice | Attending: Emergency Medicine | Admitting: Emergency Medicine

## 2024-07-23 ENCOUNTER — Other Ambulatory Visit: Payer: Self-pay

## 2024-07-23 DIAGNOSIS — R1011 Right upper quadrant pain: Secondary | ICD-10-CM | POA: Diagnosis not present

## 2024-07-23 DIAGNOSIS — Z5321 Procedure and treatment not carried out due to patient leaving prior to being seen by health care provider: Secondary | ICD-10-CM | POA: Insufficient documentation

## 2024-07-23 LAB — URINALYSIS, ROUTINE W REFLEX MICROSCOPIC
Bilirubin Urine: NEGATIVE
Glucose, UA: NEGATIVE mg/dL
Ketones, ur: NEGATIVE mg/dL
Nitrite: POSITIVE — AB
Protein, ur: NEGATIVE mg/dL
Specific Gravity, Urine: 1.02 (ref 1.005–1.030)
pH: 6 (ref 5.0–8.0)

## 2024-07-23 LAB — COMPREHENSIVE METABOLIC PANEL WITH GFR
ALT: 12 U/L (ref 0–44)
AST: 18 U/L (ref 15–41)
Albumin: 3.8 g/dL (ref 3.5–5.0)
Alkaline Phosphatase: 50 U/L (ref 38–126)
Anion gap: 10 (ref 5–15)
BUN: 16 mg/dL (ref 8–23)
CO2: 31 mmol/L (ref 22–32)
Calcium: 9.7 mg/dL (ref 8.9–10.3)
Chloride: 101 mmol/L (ref 98–111)
Creatinine, Ser: 0.83 mg/dL (ref 0.44–1.00)
GFR, Estimated: >60 mL/min (ref >60)
Glucose, Bld: 126 mg/dL — ABNORMAL HIGH (ref 70–99)
Potassium: 3.7 mmol/L (ref 3.5–5.1)
Sodium: 142 mmol/L (ref 135–145)
Total Bilirubin: 1.4 mg/dL — ABNORMAL HIGH (ref 0.0–1.2)
Total Protein: 7.1 g/dL (ref 6.5–8.1)

## 2024-07-23 LAB — CBC
HCT: 44.2 % (ref 36.0–46.0)
Hemoglobin: 14.7 g/dL (ref 12.0–15.0)
MCH: 31 pg (ref 26.0–34.0)
MCHC: 33.3 g/dL (ref 30.0–36.0)
MCV: 93.2 fL (ref 80.0–100.0)
Platelets: 170 K/uL (ref 150–400)
RBC: 4.74 MIL/uL (ref 3.87–5.11)
RDW: 13.9 % (ref 11.5–15.5)
WBC: 12.3 K/uL — ABNORMAL HIGH (ref 4.0–10.5)
nRBC: 0 % (ref 0.0–0.2)

## 2024-07-23 LAB — LIPASE, BLOOD: Lipase: 33 U/L (ref 11–51)

## 2024-07-23 NOTE — ED Triage Notes (Signed)
 Abdominal pain started yesterday, in right upper quadrant, feels like a squeeze. Pain 6 out 10. Denies nausea or vomit.

## 2024-07-23 NOTE — ED Triage Notes (Addendum)
 Additionally, pt adds she ate some nuts yesterday and began to get abdominal pain soon after. Denies any n/v/d, endorses chills and abd distention.

## 2024-07-24 ENCOUNTER — Other Ambulatory Visit: Payer: Self-pay | Admitting: Family Medicine

## 2024-07-24 DIAGNOSIS — K5793 Diverticulitis of intestine, part unspecified, without perforation or abscess with bleeding: Secondary | ICD-10-CM | POA: Diagnosis not present

## 2024-07-24 DIAGNOSIS — K5792 Diverticulitis of intestine, part unspecified, without perforation or abscess without bleeding: Secondary | ICD-10-CM

## 2024-07-24 MED ORDER — OXYCODONE-ACETAMINOPHEN 5-325 MG PO TABS
1.0000 | ORAL_TABLET | ORAL | Status: DC | PRN
  Administered 2024-07-24: 1 via ORAL
  Filled 2024-07-24: qty 1

## 2024-07-24 NOTE — ED Notes (Signed)
 Pt. Said she has a doctors appointment in the morning, checking out.

## 2024-07-24 NOTE — ED Notes (Signed)
 Pt informed staff about leaving emergency department

## 2024-07-25 ENCOUNTER — Ambulatory Visit
Admission: RE | Admit: 2024-07-25 | Discharge: 2024-07-25 | Disposition: A | Discharge status: Home / Self Care | Source: Ambulatory Visit | Attending: Family Medicine | Admitting: Family Medicine

## 2024-07-25 ENCOUNTER — Ambulatory Visit
Admission: RE | Admit: 2024-07-25 | Discharge: 2024-07-25 | Disposition: A | Discharge status: Home / Self Care | Source: Ambulatory Visit | Attending: Orthopaedic Surgery | Admitting: Orthopaedic Surgery

## 2024-07-25 DIAGNOSIS — K5792 Diverticulitis of intestine, part unspecified, without perforation or abscess without bleeding: Secondary | ICD-10-CM

## 2024-07-25 DIAGNOSIS — M19011 Primary osteoarthritis, right shoulder: Secondary | ICD-10-CM | POA: Diagnosis not present

## 2024-07-25 DIAGNOSIS — N200 Calculus of kidney: Secondary | ICD-10-CM | POA: Diagnosis not present

## 2024-07-25 MED ORDER — IOPAMIDOL (ISOVUE-300) INJECTION 61%
80.0000 mL | Freq: Once | INTRAVENOUS | Status: AC | PRN
  Administered 2024-07-25: 80 mL via INTRAVENOUS

## 2024-08-26 DIAGNOSIS — Z1231 Encounter for screening mammogram for malignant neoplasm of breast: Secondary | ICD-10-CM | POA: Diagnosis not present
